# Patient Record
Sex: Female | Born: 1970 | Race: White | Hispanic: No | Marital: Married | Smoking: Never smoker
Health system: Southern US, Community
[De-identification: ages and names within clinical notes are randomized; demographics above are authoritative.]

## PROBLEM LIST (undated history)

## (undated) DIAGNOSIS — G8929 Other chronic pain: Secondary | ICD-10-CM

## (undated) DIAGNOSIS — N3010 Interstitial cystitis (chronic) without hematuria: Secondary | ICD-10-CM

## (undated) DIAGNOSIS — M5030 Other cervical disc degeneration, unspecified cervical region: Secondary | ICD-10-CM

## (undated) DIAGNOSIS — J302 Other seasonal allergic rhinitis: Secondary | ICD-10-CM

## (undated) DIAGNOSIS — M542 Cervicalgia: Secondary | ICD-10-CM

## (undated) DIAGNOSIS — R112 Nausea with vomiting, unspecified: Secondary | ICD-10-CM

## (undated) DIAGNOSIS — M359 Systemic involvement of connective tissue, unspecified: Secondary | ICD-10-CM

## (undated) DIAGNOSIS — Z87828 Personal history of other (healed) physical injury and trauma: Secondary | ICD-10-CM

## (undated) DIAGNOSIS — I10 Essential (primary) hypertension: Secondary | ICD-10-CM

## (undated) DIAGNOSIS — K219 Gastro-esophageal reflux disease without esophagitis: Secondary | ICD-10-CM

## (undated) DIAGNOSIS — F32A Depression, unspecified: Secondary | ICD-10-CM

## (undated) DIAGNOSIS — Z9889 Other specified postprocedural states: Secondary | ICD-10-CM

## (undated) DIAGNOSIS — R29898 Other symptoms and signs involving the musculoskeletal system: Secondary | ICD-10-CM

## (undated) DIAGNOSIS — F410 Panic disorder [episodic paroxysmal anxiety] without agoraphobia: Secondary | ICD-10-CM

## (undated) DIAGNOSIS — R350 Frequency of micturition: Secondary | ICD-10-CM

## (undated) DIAGNOSIS — F419 Anxiety disorder, unspecified: Secondary | ICD-10-CM

## (undated) DIAGNOSIS — R351 Nocturia: Secondary | ICD-10-CM

## (undated) DIAGNOSIS — K589 Irritable bowel syndrome without diarrhea: Secondary | ICD-10-CM

## (undated) DIAGNOSIS — E119 Type 2 diabetes mellitus without complications: Secondary | ICD-10-CM

## (undated) DIAGNOSIS — R3915 Urgency of urination: Secondary | ICD-10-CM

## (undated) DIAGNOSIS — K649 Unspecified hemorrhoids: Secondary | ICD-10-CM

## (undated) DIAGNOSIS — F329 Major depressive disorder, single episode, unspecified: Secondary | ICD-10-CM

## (undated) DIAGNOSIS — K7689 Other specified diseases of liver: Secondary | ICD-10-CM

## (undated) HISTORY — DX: Unspecified hemorrhoids: K64.9

## (undated) HISTORY — PX: KNEE ARTHROSCOPY: SUR90

## (undated) HISTORY — PX: OTHER SURGICAL HISTORY: SHX169

## (undated) HISTORY — PX: TONSILLECTOMY: SUR1361

## (undated) SURGERY — Surgical Case
Anesthesia: *Unknown

---

## 1991-04-12 HISTORY — PX: ECTOPIC PREGNANCY SURGERY: SHX613

## 1999-09-29 ENCOUNTER — Other Ambulatory Visit: Admission: RE | Admit: 1999-09-29 | Discharge: 1999-09-29 | Payer: Self-pay | Admitting: Obstetrics and Gynecology

## 2000-08-10 ENCOUNTER — Ambulatory Visit (HOSPITAL_COMMUNITY): Admission: RE | Admit: 2000-08-10 | Discharge: 2000-08-10 | Payer: Self-pay | Admitting: *Deleted

## 2000-08-10 ENCOUNTER — Encounter: Payer: Self-pay | Admitting: *Deleted

## 2000-10-05 ENCOUNTER — Other Ambulatory Visit: Admission: RE | Admit: 2000-10-05 | Discharge: 2000-10-05 | Payer: Self-pay | Admitting: Obstetrics and Gynecology

## 2000-10-25 ENCOUNTER — Ambulatory Visit (HOSPITAL_COMMUNITY): Admission: RE | Admit: 2000-10-25 | Discharge: 2000-10-25 | Payer: Self-pay | Admitting: *Deleted

## 2000-10-25 ENCOUNTER — Encounter: Payer: Self-pay | Admitting: *Deleted

## 2001-05-01 ENCOUNTER — Ambulatory Visit (HOSPITAL_COMMUNITY): Admission: RE | Admit: 2001-05-01 | Discharge: 2001-05-01 | Payer: Self-pay | Admitting: Internal Medicine

## 2001-07-05 ENCOUNTER — Inpatient Hospital Stay (HOSPITAL_COMMUNITY): Admission: RE | Admit: 2001-07-05 | Discharge: 2001-07-07 | Payer: Self-pay | Admitting: *Deleted

## 2001-07-05 HISTORY — PX: VAGINAL HYSTERECTOMY: SUR661

## 2001-09-03 ENCOUNTER — Ambulatory Visit (HOSPITAL_COMMUNITY): Admission: RE | Admit: 2001-09-03 | Discharge: 2001-09-03 | Payer: Self-pay | Admitting: Internal Medicine

## 2001-09-03 ENCOUNTER — Encounter: Payer: Self-pay | Admitting: Internal Medicine

## 2001-11-02 ENCOUNTER — Observation Stay (HOSPITAL_COMMUNITY): Admission: RE | Admit: 2001-11-02 | Discharge: 2001-11-03 | Payer: Self-pay | Admitting: General Surgery

## 2001-11-02 HISTORY — PX: LAPAROSCOPIC CHOLECYSTECTOMY: SUR755

## 2002-05-31 ENCOUNTER — Ambulatory Visit (HOSPITAL_COMMUNITY): Admission: RE | Admit: 2002-05-31 | Discharge: 2002-05-31 | Payer: Self-pay

## 2002-05-31 ENCOUNTER — Encounter: Payer: Self-pay | Admitting: Urology

## 2002-05-31 HISTORY — PX: OTHER SURGICAL HISTORY: SHX169

## 2002-09-04 ENCOUNTER — Other Ambulatory Visit: Admission: RE | Admit: 2002-09-04 | Discharge: 2002-09-04 | Payer: Self-pay | Admitting: *Deleted

## 2002-09-18 ENCOUNTER — Ambulatory Visit (HOSPITAL_COMMUNITY): Admission: RE | Admit: 2002-09-18 | Discharge: 2002-09-18 | Payer: Self-pay | Admitting: Family Medicine

## 2002-09-18 ENCOUNTER — Encounter: Payer: Self-pay | Admitting: Family Medicine

## 2002-12-19 ENCOUNTER — Encounter: Payer: Self-pay | Admitting: Internal Medicine

## 2002-12-19 ENCOUNTER — Ambulatory Visit (HOSPITAL_COMMUNITY): Admission: RE | Admit: 2002-12-19 | Discharge: 2002-12-19 | Payer: Self-pay | Admitting: Internal Medicine

## 2003-01-20 ENCOUNTER — Ambulatory Visit (HOSPITAL_COMMUNITY): Admission: RE | Admit: 2003-01-20 | Discharge: 2003-01-20 | Payer: Self-pay | Admitting: Internal Medicine

## 2003-01-30 ENCOUNTER — Ambulatory Visit (HOSPITAL_COMMUNITY): Admission: RE | Admit: 2003-01-30 | Discharge: 2003-01-30 | Payer: Self-pay | Admitting: Family Medicine

## 2003-01-30 ENCOUNTER — Encounter: Payer: Self-pay | Admitting: Family Medicine

## 2003-02-11 ENCOUNTER — Encounter (HOSPITAL_COMMUNITY): Admission: RE | Admit: 2003-02-11 | Discharge: 2003-03-13 | Payer: Self-pay | Admitting: Family Medicine

## 2003-03-30 ENCOUNTER — Emergency Department (HOSPITAL_COMMUNITY): Admission: AC | Admit: 2003-03-30 | Discharge: 2003-03-30 | Payer: Self-pay

## 2003-04-07 ENCOUNTER — Emergency Department (HOSPITAL_COMMUNITY): Admission: AD | Admit: 2003-04-07 | Discharge: 2003-04-07 | Payer: Self-pay | Admitting: Family Medicine

## 2003-05-22 ENCOUNTER — Ambulatory Visit (HOSPITAL_COMMUNITY): Admission: RE | Admit: 2003-05-22 | Discharge: 2003-05-22 | Payer: Self-pay | Admitting: Family Medicine

## 2003-07-19 ENCOUNTER — Emergency Department (HOSPITAL_COMMUNITY): Admission: AD | Admit: 2003-07-19 | Discharge: 2003-07-19 | Payer: Self-pay | Admitting: Family Medicine

## 2003-08-04 ENCOUNTER — Ambulatory Visit (HOSPITAL_COMMUNITY): Admission: RE | Admit: 2003-08-04 | Discharge: 2003-08-04 | Payer: Self-pay | Admitting: Internal Medicine

## 2003-09-01 ENCOUNTER — Ambulatory Visit (HOSPITAL_BASED_OUTPATIENT_CLINIC_OR_DEPARTMENT_OTHER): Admission: RE | Admit: 2003-09-01 | Discharge: 2003-09-01 | Payer: Self-pay | Admitting: Urology

## 2003-09-01 ENCOUNTER — Ambulatory Visit (HOSPITAL_COMMUNITY): Admission: RE | Admit: 2003-09-01 | Discharge: 2003-09-01 | Payer: Self-pay | Admitting: Urology

## 2003-09-15 ENCOUNTER — Other Ambulatory Visit: Admission: RE | Admit: 2003-09-15 | Discharge: 2003-09-15 | Payer: Self-pay | Admitting: Obstetrics and Gynecology

## 2003-10-01 ENCOUNTER — Ambulatory Visit (HOSPITAL_COMMUNITY): Admission: RE | Admit: 2003-10-01 | Discharge: 2003-10-01 | Payer: Self-pay | Admitting: Internal Medicine

## 2004-03-15 ENCOUNTER — Other Ambulatory Visit: Admission: RE | Admit: 2004-03-15 | Discharge: 2004-03-15 | Payer: Self-pay | Admitting: Obstetrics and Gynecology

## 2004-07-15 ENCOUNTER — Other Ambulatory Visit: Admission: RE | Admit: 2004-07-15 | Discharge: 2004-07-15 | Payer: Self-pay | Admitting: Obstetrics and Gynecology

## 2004-09-11 IMAGING — CT CT PELVIS W/O CM
1 of 2 series · 15 of 32 positions shown, 19 images · non-contrast
Comparison: none

CLINICAL DATA: Rt flank pain; hematuria; hx of kidney stones
 CT ABDOMEN WITHOUT CONTRAST
 Multidetector helical CT imaging abdomen and pelvis performed using kidney stone protocol.  Neither oral nor intravenous contrast utilized for this indication.  Comparison 12/19/02.  
 ABDOMEN
 Lung bases clear.  Status post cholecystectomy.  Tiny bilateral renal calculi.  No hydronephrosis or ureteral calcification seen.  Tiny hepatic cyst stable, 7 mm diameter (image 14).  Remaining solid organs and bowel loops unremarkable for exam lacking IV and oral contrast.  
 IMPRESSION
 Tiny bilateral renal calculi without hydronephrosis or urinary tract obstruction.  Tiny hepatic cysts stable.
 PELVIS
 Very small lipoma right iliopsoas, 2.0 x 1.0 cm in size (image 39).  No distal ureteral calcification or dilatation.  No pelvic mass, adenopathy, or free fluid.  Tiny left pelvic phlebolith.  Bladder decompressed.  Stool throughout colon.  Status post hysterectomy.  No definite mass identified within limitations of an exam lacking oral and IV contrast.  A short segment of the appendix is visualized and is normal. 
 Slight constipation.  Otherwise negative exam.

[Series 5091: — · axial · 0.56mm/px · z∈[+1346,+1676]mm · 15 of 74 slices shown, 19 images]
[im 4/74  soft-tissue]
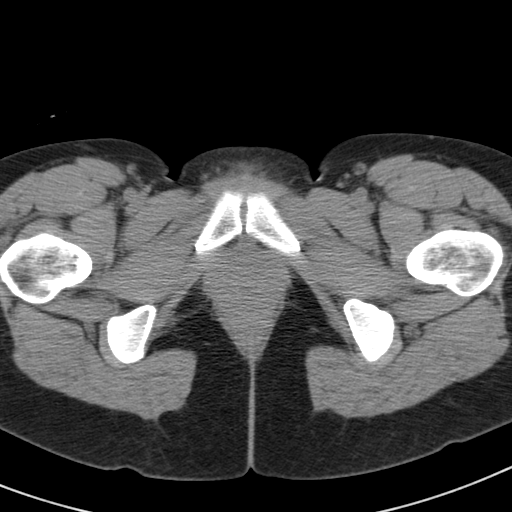
[im 4/74  bone]
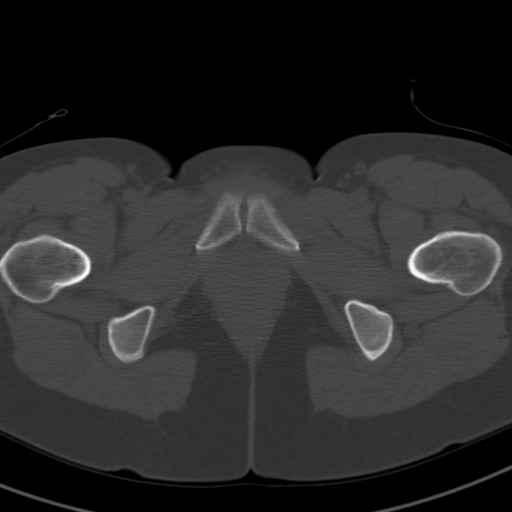
[im 10/74  soft-tissue]
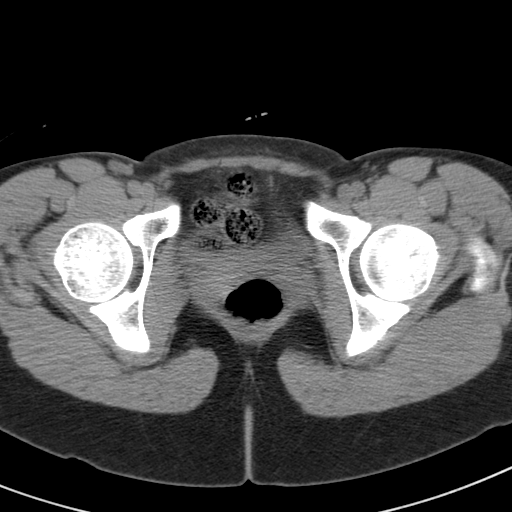
[im 16/74  soft-tissue]
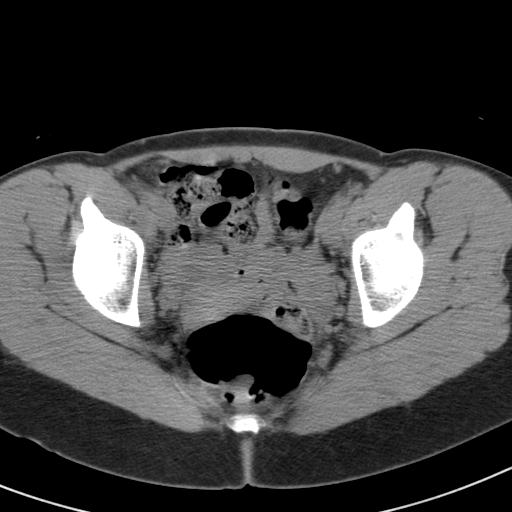
[im 20/74  soft-tissue]
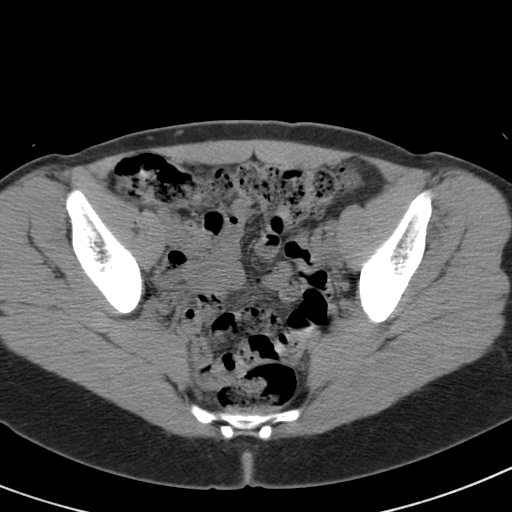
[im 26/74  soft-tissue]
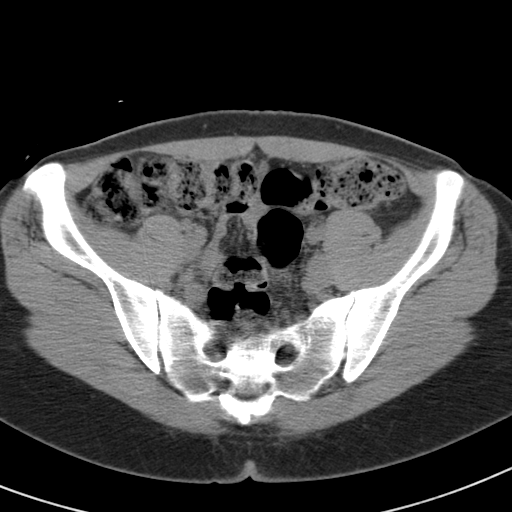
[im 32/74  soft-tissue]
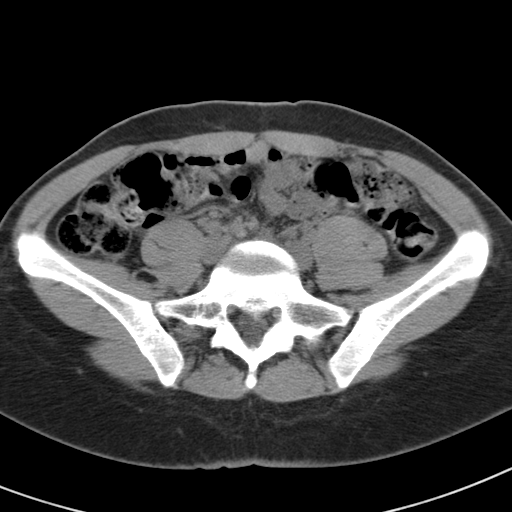
[im 39/74  soft-tissue]
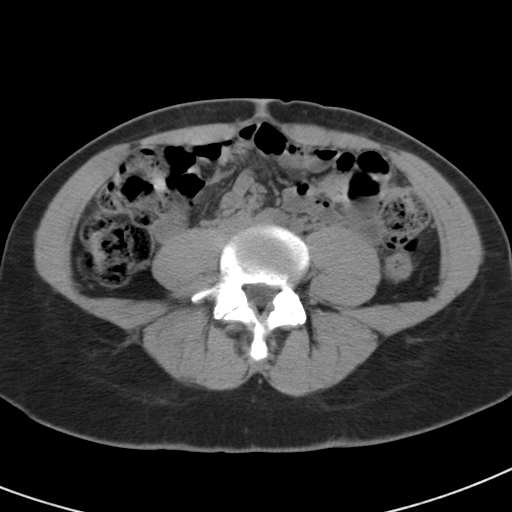
[im 42/74  soft-tissue]
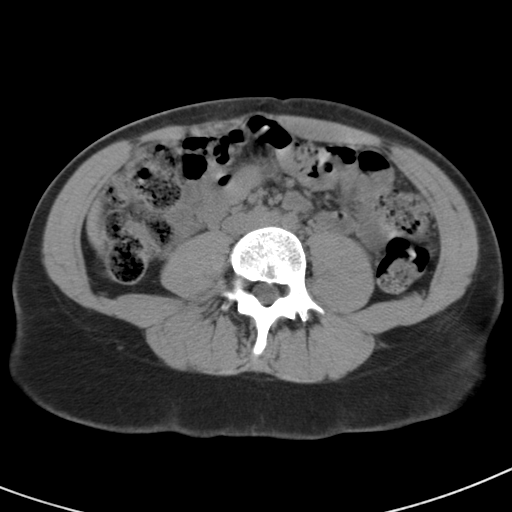
[im 48/74  soft-tissue]
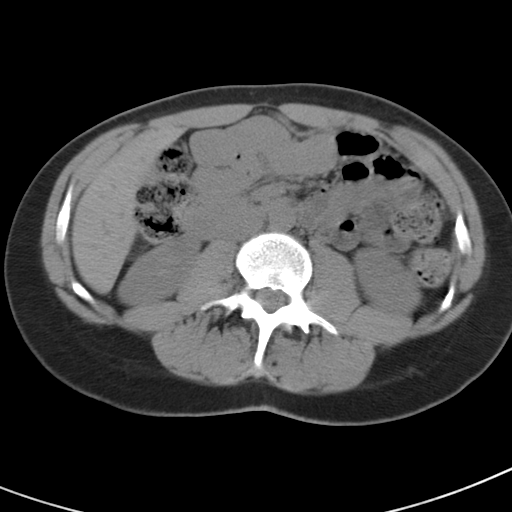
[im 48/74  bone]
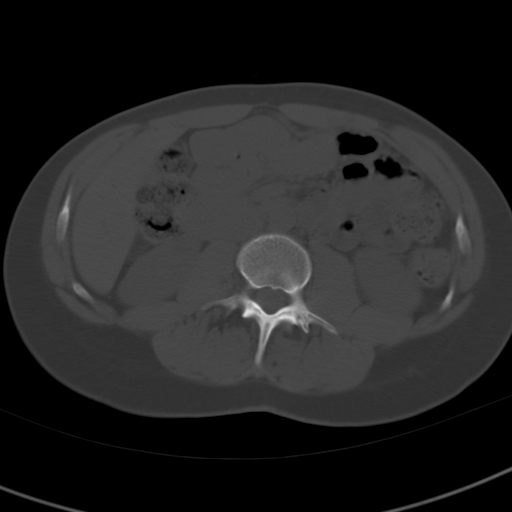
[im 54/74  soft-tissue]
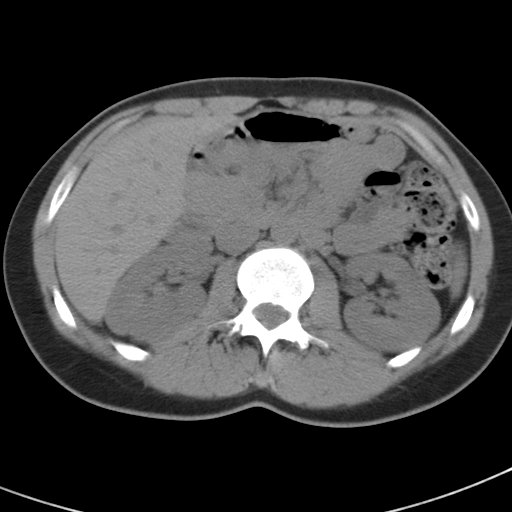
[im 58/74  soft-tissue]
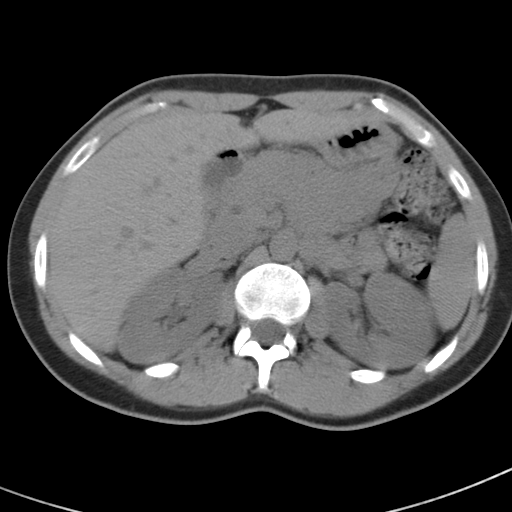
[im 61/74  lung]
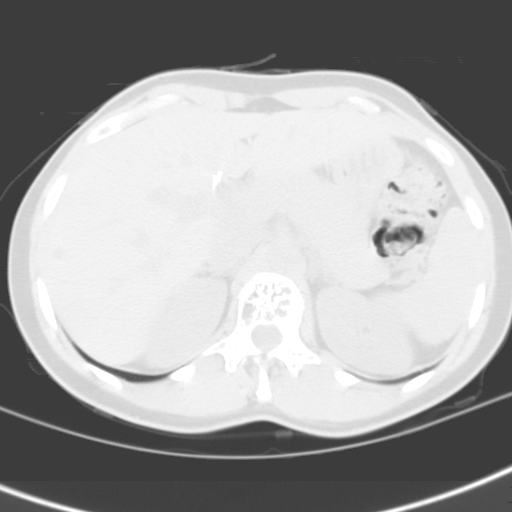
[im 64/74  soft-tissue]
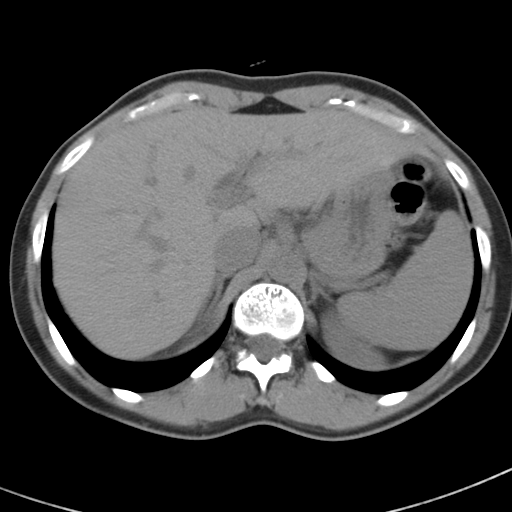
[im 64/74  lung]
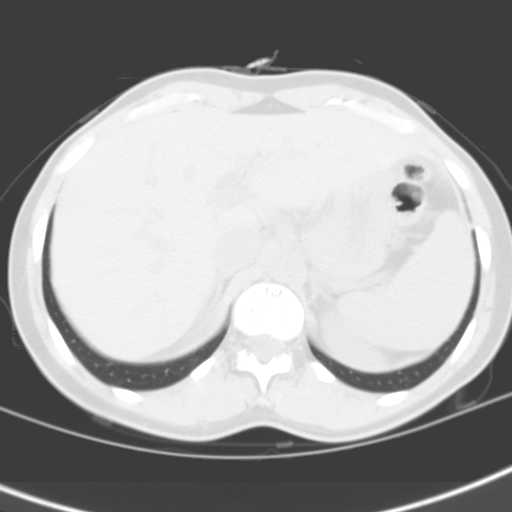
[im 67/74  lung]
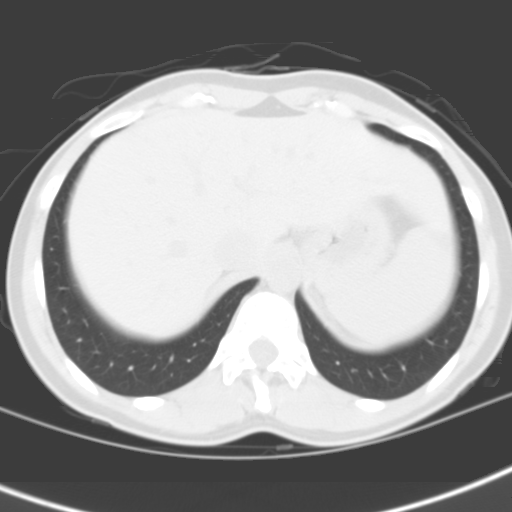
[im 70/74  soft-tissue]
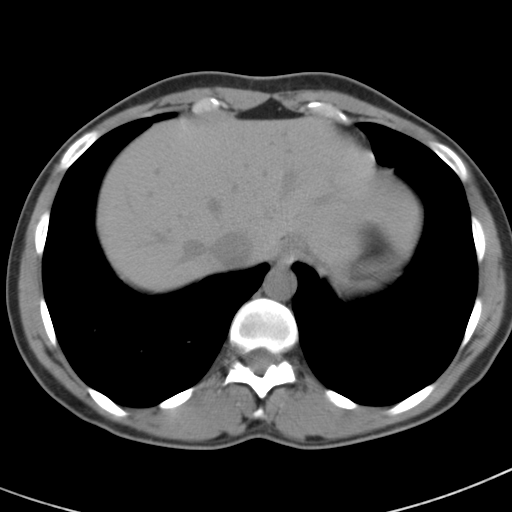
[im 70/74  lung]
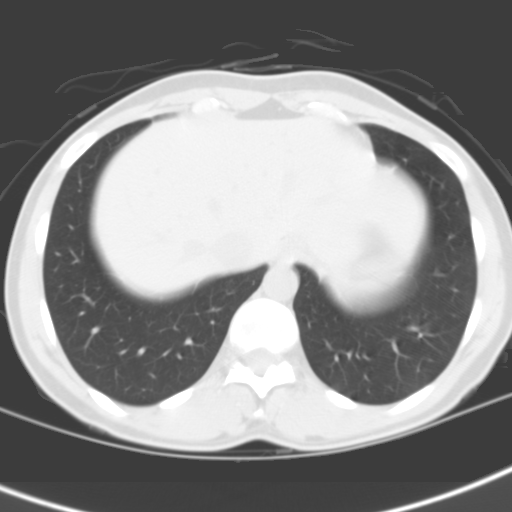

[15 of 32 positions shown; findings below may reference images not displayed]

## 2004-09-20 ENCOUNTER — Emergency Department (HOSPITAL_COMMUNITY): Admission: EM | Admit: 2004-09-20 | Discharge: 2004-09-20 | Payer: Self-pay | Admitting: Emergency Medicine

## 2005-01-24 ENCOUNTER — Other Ambulatory Visit: Admission: RE | Admit: 2005-01-24 | Discharge: 2005-01-24 | Payer: Self-pay | Admitting: Obstetrics and Gynecology

## 2005-02-21 ENCOUNTER — Ambulatory Visit (HOSPITAL_COMMUNITY): Admission: RE | Admit: 2005-02-21 | Discharge: 2005-02-21 | Payer: Self-pay | Admitting: Family Medicine

## 2005-02-24 ENCOUNTER — Ambulatory Visit (HOSPITAL_COMMUNITY): Admission: RE | Admit: 2005-02-24 | Discharge: 2005-02-24 | Payer: Self-pay | Admitting: Family Medicine

## 2005-04-25 ENCOUNTER — Ambulatory Visit (HOSPITAL_COMMUNITY): Admission: RE | Admit: 2005-04-25 | Discharge: 2005-04-26 | Payer: Self-pay | Admitting: Neurosurgery

## 2005-04-25 HISTORY — PX: ANTERIOR CERVICAL DECOMP/DISCECTOMY FUSION: SHX1161

## 2006-04-03 ENCOUNTER — Encounter: Admission: RE | Admit: 2006-04-03 | Discharge: 2006-04-03 | Payer: Self-pay | Admitting: Neurosurgery

## 2006-05-22 ENCOUNTER — Ambulatory Visit (HOSPITAL_COMMUNITY): Admission: RE | Admit: 2006-05-22 | Discharge: 2006-05-22 | Payer: Self-pay | Admitting: Neurosurgery

## 2006-06-06 ENCOUNTER — Emergency Department (HOSPITAL_COMMUNITY): Admission: EM | Admit: 2006-06-06 | Discharge: 2006-06-06 | Payer: Self-pay | Admitting: Family Medicine

## 2006-06-07 ENCOUNTER — Emergency Department (HOSPITAL_COMMUNITY): Admission: EM | Admit: 2006-06-07 | Discharge: 2006-06-07 | Payer: Self-pay | Admitting: Family Medicine

## 2006-06-07 ENCOUNTER — Emergency Department (HOSPITAL_COMMUNITY): Admission: EM | Admit: 2006-06-07 | Discharge: 2006-06-07 | Payer: Self-pay | Admitting: Emergency Medicine

## 2006-09-26 ENCOUNTER — Ambulatory Visit (HOSPITAL_BASED_OUTPATIENT_CLINIC_OR_DEPARTMENT_OTHER): Admission: RE | Admit: 2006-09-26 | Discharge: 2006-09-26 | Payer: Self-pay | Admitting: Orthopaedic Surgery

## 2007-01-24 ENCOUNTER — Ambulatory Visit: Payer: Self-pay | Admitting: Internal Medicine

## 2007-02-15 ENCOUNTER — Ambulatory Visit: Payer: Self-pay | Admitting: Internal Medicine

## 2007-02-15 ENCOUNTER — Ambulatory Visit (HOSPITAL_COMMUNITY): Admission: RE | Admit: 2007-02-15 | Discharge: 2007-02-15 | Payer: Self-pay | Admitting: Internal Medicine

## 2007-02-15 HISTORY — PX: ESOPHAGOGASTRODUODENOSCOPY: SHX1529

## 2007-05-29 ENCOUNTER — Ambulatory Visit: Payer: Self-pay | Admitting: Internal Medicine

## 2007-06-13 ENCOUNTER — Ambulatory Visit: Payer: Self-pay | Admitting: Internal Medicine

## 2007-06-18 ENCOUNTER — Ambulatory Visit: Payer: Self-pay | Admitting: Internal Medicine

## 2007-06-18 ENCOUNTER — Ambulatory Visit (HOSPITAL_COMMUNITY): Admission: RE | Admit: 2007-06-18 | Discharge: 2007-06-18 | Payer: Self-pay | Admitting: Internal Medicine

## 2007-06-18 HISTORY — PX: ESOPHAGOGASTRODUODENOSCOPY: SHX1529

## 2007-06-18 HISTORY — PX: BRAVO PH STUDY: SHX5421

## 2007-07-18 ENCOUNTER — Ambulatory Visit: Payer: Self-pay | Admitting: Gastroenterology

## 2007-10-25 ENCOUNTER — Ambulatory Visit: Payer: Self-pay | Admitting: Internal Medicine

## 2007-11-13 ENCOUNTER — Ambulatory Visit (HOSPITAL_COMMUNITY): Admission: RE | Admit: 2007-11-13 | Discharge: 2007-11-13 | Payer: Self-pay | Admitting: Anesthesiology

## 2007-12-20 DIAGNOSIS — K649 Unspecified hemorrhoids: Secondary | ICD-10-CM | POA: Insufficient documentation

## 2007-12-20 DIAGNOSIS — K219 Gastro-esophageal reflux disease without esophagitis: Secondary | ICD-10-CM

## 2007-12-20 DIAGNOSIS — Z8659 Personal history of other mental and behavioral disorders: Secondary | ICD-10-CM | POA: Insufficient documentation

## 2007-12-20 DIAGNOSIS — K589 Irritable bowel syndrome without diarrhea: Secondary | ICD-10-CM | POA: Insufficient documentation

## 2007-12-20 DIAGNOSIS — I10 Essential (primary) hypertension: Secondary | ICD-10-CM | POA: Insufficient documentation

## 2007-12-20 DIAGNOSIS — N200 Calculus of kidney: Secondary | ICD-10-CM | POA: Insufficient documentation

## 2007-12-20 DIAGNOSIS — R12 Heartburn: Secondary | ICD-10-CM

## 2007-12-20 DIAGNOSIS — R112 Nausea with vomiting, unspecified: Secondary | ICD-10-CM | POA: Insufficient documentation

## 2008-01-01 ENCOUNTER — Ambulatory Visit: Payer: Self-pay | Admitting: Internal Medicine

## 2008-12-05 ENCOUNTER — Ambulatory Visit: Payer: Self-pay | Admitting: Internal Medicine

## 2008-12-31 ENCOUNTER — Encounter: Payer: Self-pay | Admitting: Urgent Care

## 2009-02-02 ENCOUNTER — Encounter: Payer: Self-pay | Admitting: Urgent Care

## 2009-02-17 ENCOUNTER — Encounter: Payer: Self-pay | Admitting: Gastroenterology

## 2009-03-05 IMAGING — CR DG SACRUM/COCCYX 2+V
3 series · 3 of 3 positions shown · non-contrast
Comparison: None

CLINICAL DATA: Injury 1 month ago.  Pain.

SACRUM AND COCCYX - 2+ VIEW

[t sacrum a.p.]
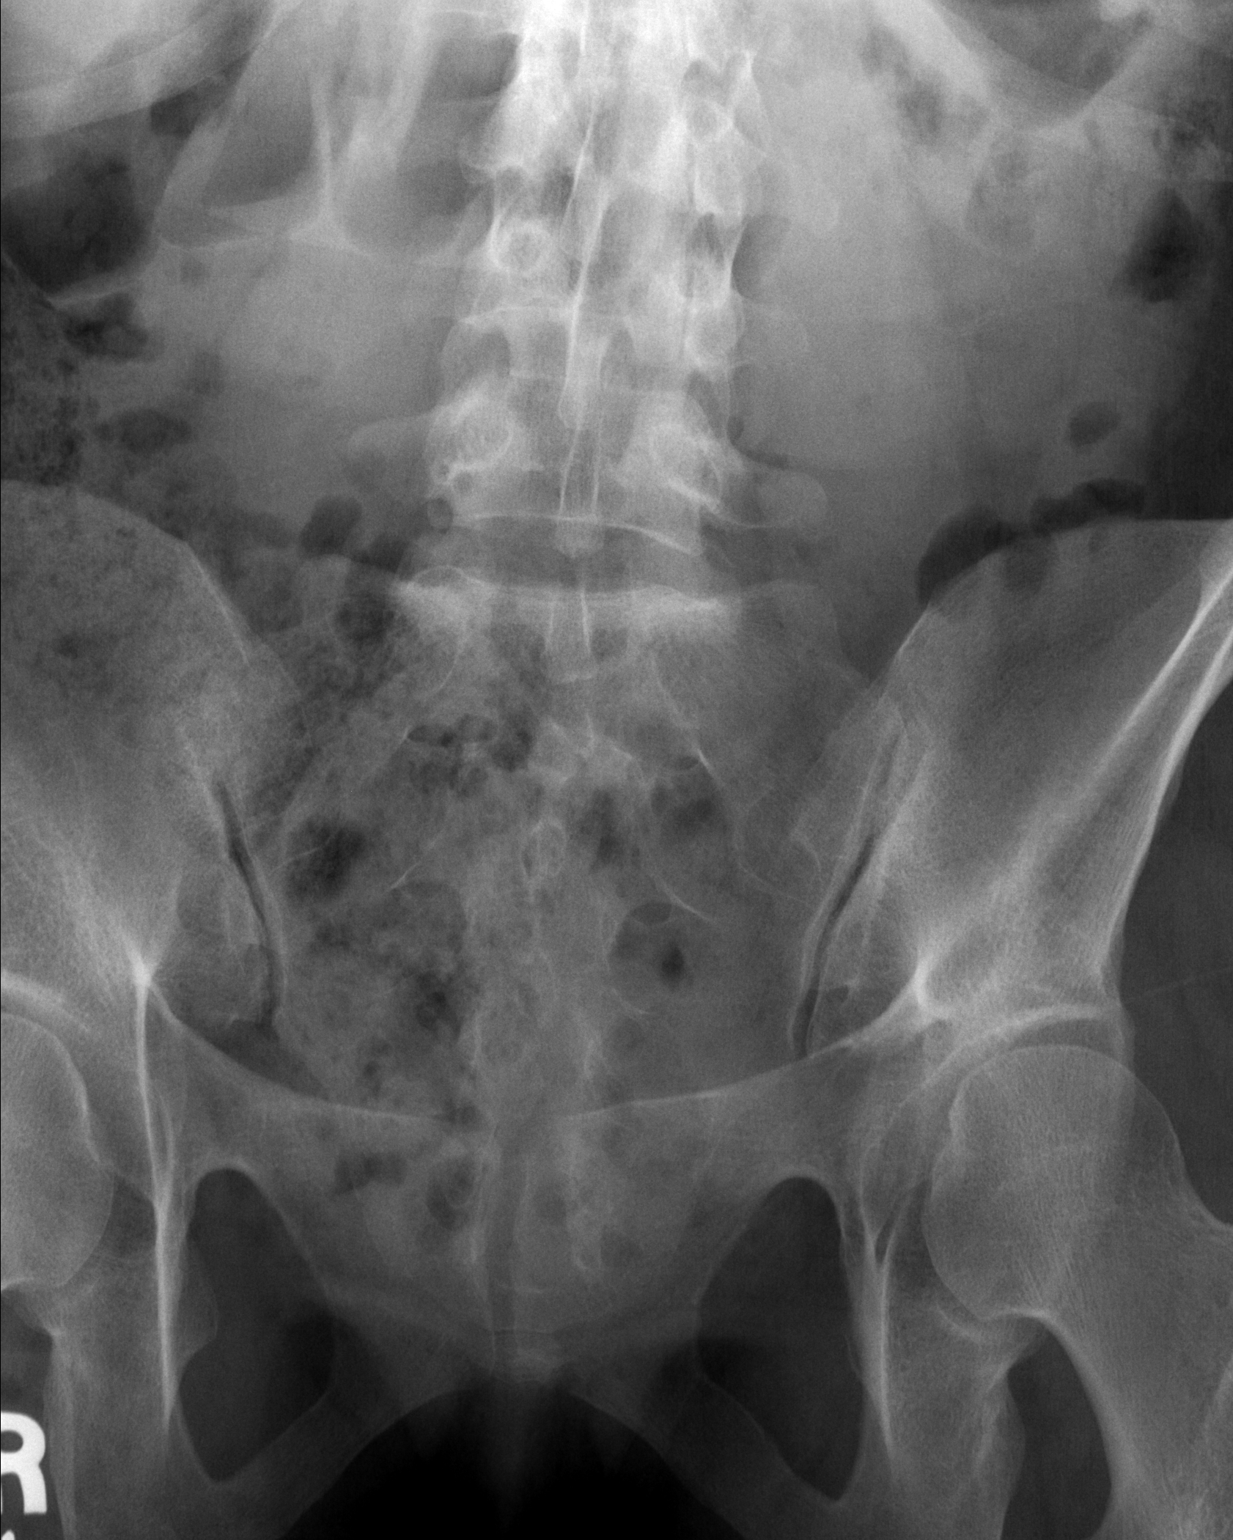

[t coccyx a.p.]
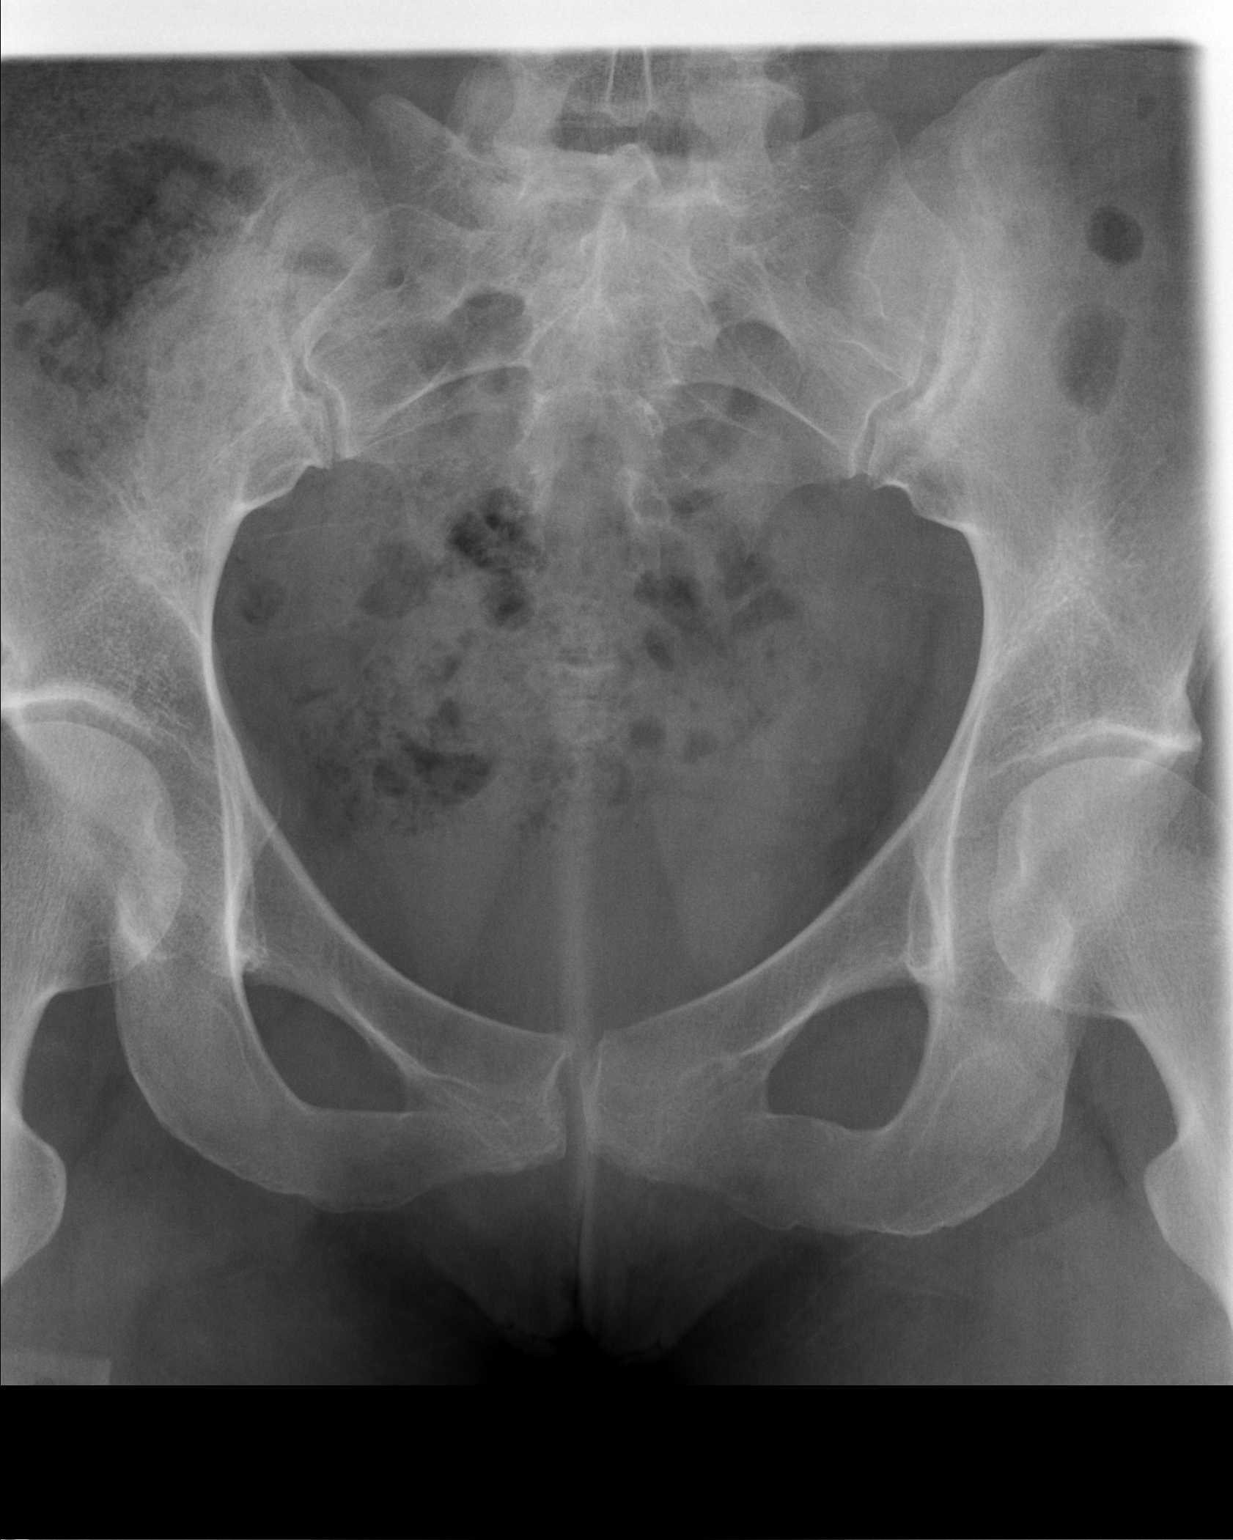

[t coccyx lat]
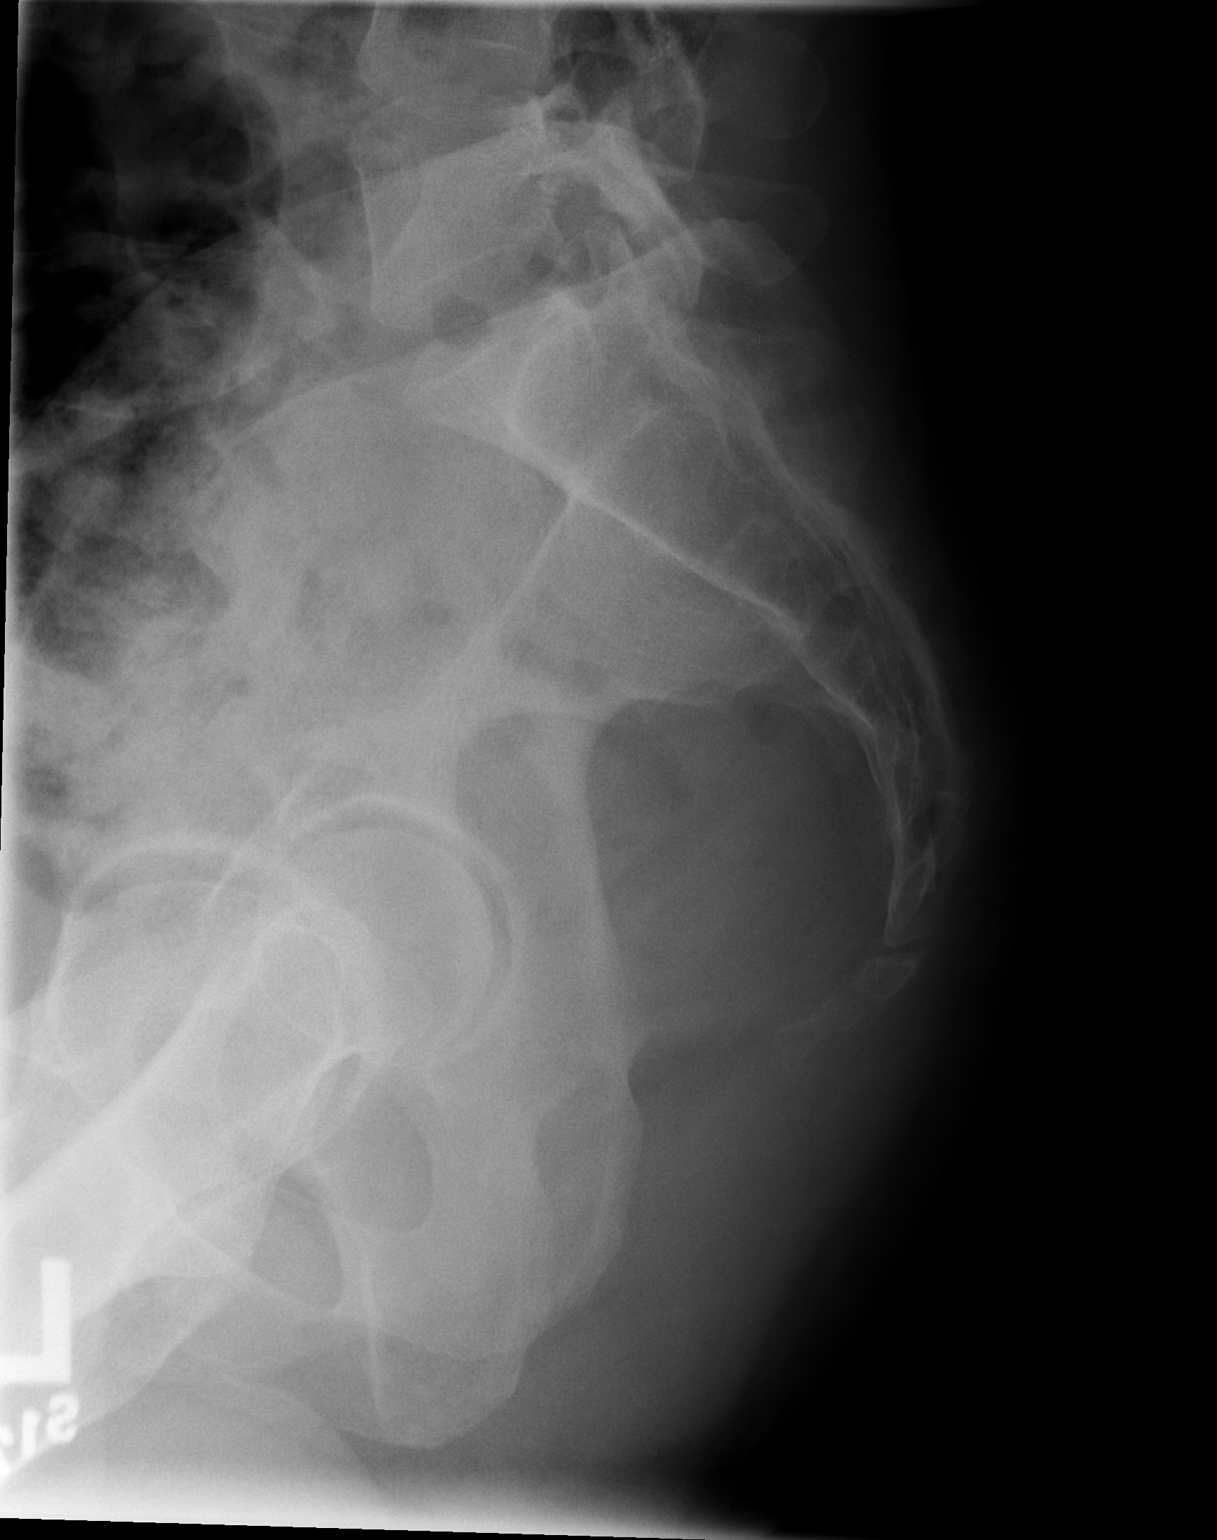

[3 of 3 positions shown; findings below may reference images not displayed]

FINDINGS: There is no evidence of sacral or coccygeal fracture.  SI
joints appear normal.
IMPRESSION: Negative

## 2009-04-08 ENCOUNTER — Encounter: Payer: Self-pay | Admitting: Gastroenterology

## 2009-04-13 ENCOUNTER — Encounter: Payer: Self-pay | Admitting: Gastroenterology

## 2009-04-14 ENCOUNTER — Encounter: Payer: Self-pay | Admitting: Gastroenterology

## 2009-11-10 ENCOUNTER — Ambulatory Visit: Payer: Self-pay | Admitting: Internal Medicine

## 2010-01-13 ENCOUNTER — Encounter: Payer: Self-pay | Admitting: Urgent Care

## 2010-01-26 ENCOUNTER — Ambulatory Visit (HOSPITAL_BASED_OUTPATIENT_CLINIC_OR_DEPARTMENT_OTHER): Admission: RE | Admit: 2010-01-26 | Discharge: 2010-01-26 | Payer: Self-pay | Admitting: Urology

## 2010-03-11 ENCOUNTER — Encounter (INDEPENDENT_AMBULATORY_CARE_PROVIDER_SITE_OTHER): Payer: Self-pay | Admitting: *Deleted

## 2010-05-13 NOTE — Assessment & Plan Note (Signed)
Summary: f/u gerd/jbb   Visit Type:  Follow-up Visit [facility]:  Dr Sherwood Gambler  Chief Complaint:  follow up- doing ok most of the time.  History of Present Illness: 40 y/o caucasian female here FU GERD.  Denies heartburn & indigestion, unless she "stresses".  Stays away from spicy foods.  Wants anti-spasmotics, diarrhea w/ IBS.  Married October 2010.  c/o hemorrhoid c/o proctalgia, no bleeding.  Taking Nexium 40mg  two times a day, is willing to try & decrease to once daily dosage.  Current Problems (verified): 1)  Family History of Pancreatic Cancer  () 2)  Family History of Thyroid Disease  () 3)  Amily History of Diabetes  () 4)  Hemorrhoids  (ICD-455.6) 5)  Depression  () 6)  Irritable Bowel Syndrome  (ICD-564.1) 7)  Anxiety Disorder, Hx of  (ICD-V11.2) 8)  Nephrolithiasis  (ICD-592.0) 9)  Arthritis  () 10)  Hypertension  (ICD-401.9) 11)  Nausea With Vomiting  (ICD-787.01) 12)  Heartburn  (ICD-787.1) 13)  Gerd  (ICD-530.81)  Current Medications (verified): 1)  Propranolol Hcl 20 Mg Tabs (Propranolol Hcl) .... Daily 2)  Nexium 40 Mg Cpdr (Esomeprazole Magnesium) .... One By Mouth Two Times A Day For Acid Reflux 3)  Tylox .... One Daily 4)  Lactaid .... Two Times A Day As Needed 5)  Alprazolam 0.5mg  .... As Needed 6)  Miralax  Powd (Polyethylene Glycol 3350) .... Once Daily 7)  Amitriptyline Hcl 25 Mg Tabs (Amitriptyline Hcl) .... As Needed 8)  Hyomax-Sl 0.125 Mg Subl (Hyoscyamine Sulfate) .Marland Kitchen.. 1 By Mouth Ac/hs (Up To Qid) For Ibs 9)  Nexium 40 Mg Cpdr (Esomeprazole Magnesium) .Marland Kitchen.. 1 By Mouth Bid For Acid Reflux 10)  Anusol-Hc 25 Mg Supp (Hydrocortisone Acetate) .Marland Kitchen.. 1 Pr Two Times A Day  Allergies (verified): 1)  ! Morphine  Past History:  Past Medical History: HEMORRHOIDS (ICD-455.6) colonoscopy 2003 DEPRESSION IRRITABLE BOWEL SYNDROME (ICD-564.1) ANXIETY DISORDER, HX OF (ICD-V11.2) NEPHROLITHIASIS (ICD-592.0) ARTHRITIS HYPERTENSION GERD (ICD-530.81) EGD  normal 06/2007, Bravo pH->good control on two times a day PPI  Past Surgical History: H-pylori serology EGD Bilateral knee replacement Bilateral tubal ligation  partial hysterectomy cholecystectomy cervical disk surgeryy  Social History: Marital Status:yes Children: 2 Occupation: [place] [employer] Patient has never smoked.  Alcohol Use - no  Review of Systems      See HPI  Vital Signs:  Patient profile:   40 year old female Height:      65 inches Weight:      139 pounds BMI:     23.21 Temp:     98.0 degrees F oral Pulse rate:   68 / minute BP sitting:   124 / 80  (left arm) Cuff size:   regular  Vitals Entered By: Hendricks Limes LPN (November 10, 2009 12:13 PM)  Physical Exam  General:  alert conversant no acute distress Eyes:  no scleral icterus conjunctivae pink Mouth:  No deformity or lesions, dentition normal. Lungs:  Clear throughout to auscultation. Heart:  Regular rate and rhythm; no murmurs, rubs,  or bruits. Abdomen:  Soft, nontender and nondistended. No masses, hepatosplenomegaly or hernias noted. Normal bowel sounds. Msk:  Symmetrical with no gross deformities. Normal posture. Pulses:  Normal pulses noted. Neurologic:  Alert and  oriented x4;  grossly normal neurologically. Skin:  Intact without significant lesions or rashes. Cervical Nodes:  No significant cervical adenopathy. Psych:  Alert and cooperative. Normal mood and affect.  Impression & Recommendations:  Problem # 1:  GERD (ICD-530.81) well  controlled on two times a day protonix.  Trial once daily dosing.  Discussed potential increased risk osteoporosis. Orders: Est. Patient Level III (78295)  Problem # 2:  IRRITABLE BOWEL SYNDROME (ICD-564.1) At baseline, doing well  Problem # 3:  HEMORRHOIDS (ICD-455.6) flare  Patient Instructions: 1)  Start calcium 1200mg  daily 2)  Follow up w/ PCP about bone density screening 3)  Trial nexium once daily, if unable, let us know 4)  Please schedule a  follow-up appointment as needed or 2 yrs Dr Jena Gauss 5)  The medication list was reviewed and reconciled.  All changed / newly prescribed medications were explained.  A complete medication list was provided to the patient / caregiver. Prescriptions: ANUSOL-HC 25 MG SUPP (HYDROCORTISONE ACETATE) 1 pr two times a day  #20 x 0   Entered and Authorized by:   Joselyn Arrow FNP-BC   Signed by:   Joselyn Arrow FNP-BC on 11/10/2009   Method used:   Electronically to        [facility] (retail)       [address]       [place], [place]  [address]       Ph: [phone]       Fax: [phone]   RxID:   [id number] NEXIUM 40 MG CPDR (ESOMEPRAZOLE MAGNESIUM) 1 by mouth bid for acid reflux  #60 x 5   Entered and Authorized by:   Joselyn Arrow FNP-BC   Signed by:   Joselyn Arrow FNP-BC on 11/10/2009   Method used:   Electronically to        [facility] (retail)       [address]       [place], [place]  [address]       Ph: [phone]       Fax: [phone]   RxID:   [id number] HYOMAX-SL 0.125 MG SUBL (HYOSCYAMINE SULFATE) 1 by mouth ac/hs (up to QID) for IBS  #60 x 5   Entered and Authorized by:   Joselyn Arrow FNP-BC   Signed by:   Joselyn Arrow FNP-BC on 11/10/2009   Method used:   Electronically to        [facility] (retail)       [address]       [place], [place]  [address]       Ph: [phone]       Fax: [phone]   RxID:   [id number]   Appended Document: f/u gerd/jbb 1 YEAR F/U APPT W/DR Jena Gauss IS IN THE COMPUTER PER KANDICE JONES/LAW  Appended Document: Orders Update    Clinical Lists Changes  Orders: Added new Service order of Est. Patient Level III (03500) - Signed

## 2010-05-13 NOTE — Medication Information (Signed)
Summary: PA for Nexium  PA for Nexium   Imported By: Hendricks Limes LPN 09/81/1914 78:29:56  _____________________________________________________________________  External Attachment:    Type:   Image     Comment:   External Document

## 2010-05-13 NOTE — Medication Information (Signed)
Summary: Tax adviser   Imported By: Diana Eves 04/14/2009 08:53:47  _____________________________________________________________________  External Attachment:    Type:   Image     Comment:   External Document  Appended Document: RX Folder addressed

## 2010-05-13 NOTE — Medication Information (Signed)
Summary: Tax adviser   Imported By: Ricard Dillon 04/13/2009 16:48:57  _____________________________________________________________________  External Attachment:    Type:   Image     Comment:   External Document  Appended Document: RX Folder duplicate

## 2010-05-13 NOTE — Medication Information (Signed)
Summary: NEXIUM 40MG   NEXIUM 40MG    Imported By: Rexene Alberts 03/11/2010 09:10:44  _____________________________________________________________________  External Attachment:    Type:   Image     Comment:   External Document  Appended Document: NEXIUM 40MG  needs prior auth.  Appended Document: NEXIUM 40MG  paperwork started

## 2010-06-23 LAB — POCT I-STAT 4, (NA,K, GLUC, HGB,HCT)
Glucose, Bld: 97 mg/dL (ref 70–99)
HCT: 33 % — ABNORMAL LOW (ref 36.0–46.0)
Hemoglobin: 11.2 g/dL — ABNORMAL LOW (ref 12.0–15.0)
Potassium: 4.5 mEq/L (ref 3.5–5.1)
Sodium: 141 mEq/L (ref 135–145)

## 2010-06-28 ENCOUNTER — Encounter: Payer: Self-pay | Admitting: Gastroenterology

## 2010-07-08 NOTE — Medication Information (Signed)
Summary: PA for nexium  PA for nexium   Imported By: Hendricks Limes LPN 21/30/8657 84:69:62  _____________________________________________________________________  External Attachment:    Type:   Image     Comment:   External Document

## 2010-07-21 ENCOUNTER — Other Ambulatory Visit: Payer: Self-pay | Admitting: Anesthesiology

## 2010-07-21 DIAGNOSIS — R52 Pain, unspecified: Secondary | ICD-10-CM

## 2010-07-29 ENCOUNTER — Ambulatory Visit (HOSPITAL_COMMUNITY)
Admission: RE | Admit: 2010-07-29 | Discharge: 2010-07-29 | Disposition: A | Discharge status: Home / Self Care | Payer: 59 | Source: Ambulatory Visit | Attending: Anesthesiology | Admitting: Anesthesiology

## 2010-07-29 DIAGNOSIS — Z981 Arthrodesis status: Secondary | ICD-10-CM | POA: Insufficient documentation

## 2010-07-29 DIAGNOSIS — M25519 Pain in unspecified shoulder: Secondary | ICD-10-CM | POA: Insufficient documentation

## 2010-07-29 DIAGNOSIS — M542 Cervicalgia: Secondary | ICD-10-CM | POA: Insufficient documentation

## 2010-07-29 DIAGNOSIS — M5020 Other cervical disc displacement, unspecified cervical region: Secondary | ICD-10-CM | POA: Insufficient documentation

## 2010-07-29 DIAGNOSIS — R52 Pain, unspecified: Secondary | ICD-10-CM

## 2010-07-30 ENCOUNTER — Other Ambulatory Visit: Payer: Self-pay | Admitting: Anesthesiology

## 2010-07-30 DIAGNOSIS — M542 Cervicalgia: Secondary | ICD-10-CM

## 2010-07-30 DIAGNOSIS — M25519 Pain in unspecified shoulder: Secondary | ICD-10-CM

## 2010-08-24 NOTE — Assessment & Plan Note (Signed)
NAMEMarland Kitchen  Colleen Duncan, Colleen Duncan                   CHART#:  [id number]   DATE:  01/01/2008                       DOB:  03-09-1971   CHIEF COMPLAINT:  Yellow mucus and blood in the stool, diarrhea,  abdominal pain, nausea, and vomiting.   PROBLEM LIST:  1. Gastroesophageal reflux disease.  2. IBS.  3. Bravo ambulatory esophageal pH study on June 18, 2007, done on      Protonix 40 mg b.i.d., revealed good control of gastroesophageal      reflux on acid suppression.  4. EGD on 06/18/2007 revealed normal esophagus and stomach, D1 and D2.  5. Anxiety.  6. Nephrolithiasis.  7. Colonoscopy 2003 revealed internal hemorrhoids.  8. Cholecystectomy in July 2003.   SUBJECTIVE:  The patient is here for further evaluation of above stated  symptoms.  She was last seen on 10/25/2007, was doing well at that time.  She states that she had been doing reasonably well up until 2 weeks ago.  One evening, she had a hamburger from a local restaurant, 2 hours later  she started having abdominal cramping followed by multiple bowel  movements, which were initially solid and proceeded to loose watery  stools.  She also had nausea and vomiting with it.  Over the last 2  weeks, she has had intermittent symptoms predominantly of multiple  stools and passage of the yellow and bloody mucus.  She tried 1 teaspoon  of Metamucil daily.  She did not have a lot of gas.  She continues to  have 3-4 stools a day.  Prior to this she had been taking MiraLax about  every other day or tendency towards constipation.  She notices if she  eats anything greasy, spicy, or red meat, or dairy products she tends to  have more problems with her IBS.  She is on Lactaid b.i.d. with little  improvement in her bloating and gastric discomfort.  She states her  refluxes are okay as long as she is on her Nexium b.i.d., but she does  note that her coverage will end as of January 10, 2008, and we will have  to fill out another prior authorization.  She has  already had well  documented failure on Prilosec, Protonix, Prevacid, Zegerid, and  Aciphex.   CURRENT MEDICATIONS:  See updated list.   ALLERGIES:  Morphine.   PHYSICAL EXAMINATION:  VITAL SIGNS:  Weight 138, temp 97.8, blood  pressure 104/72, and pulse 80.  GENERAL:  A pleasant, well-nourished, well-developed Caucasian female in  no acute distress.  SKIN:  Warm and dry and no jaundice.  HEENT:  Sclerae nonicteric.  Oropharyngeal mucosa moist and pink.  CHEST:  Lungs are clear to auscultation.  CARDIOVASCULAR:  Regular rate and rhythm.  No murmurs, rubs, or gallops.  ABDOMEN:  Positive bowel sounds.  Abdomen is soft and nondistended.  She  has mild tenderness in the left lower quadrant region to deep palpation.  No rebound or guarding.  No organomegaly or masses.  No abdominal bruits  or hernia.  LOWER EXTREMITIES:  No edema.   IMPRESSION:  The patient is a 40 year old lady with history of  gastroesophageal reflux disease and irritable bowel syndrome who  presents with a 2-week history of diarrhea with initially vomiting and  abdominal cramp.  These occurred 2 hours after eating  a hamburger, less  likely dealing with food poisoning.  I suspect she is having the flare  of irritable bowel syndrome, however, we need to consider the  possibility of an infectious etiology.  Gastroesophageal reflux disease  symptoms well controlled.   PLAN:  1. We will check stool for C. diff culture, O&P, and lactoferrin.  2. Align 1 daily, #30 samples provided.  3. HyoMax sublingual q.a.c., nightly, p.r.n., abdominal cramps,      diarrhea, #60 with 1 refill.  4. Based on stool studies, we may consider repeat colonoscopy early      for history of hematochezia with last colonoscopy being over 6      years ago.       Tana Coast, P.A.  Electronically Signed     R. Roetta Sessions, M.D.  Electronically Signed    LL/MEDQ  D:  01/01/2008  T:  01/01/2008  Job:  [id number]

## 2010-08-24 NOTE — H&P (Signed)
NAMESYDELL, PROWELL                  ACCOUNT NO.:  [id number]   MEDICAL RECORD NO.:  [id number]          PATIENT TYPE:  AMB   LOCATION:  DAY                           FACILITY:  [facility]   PHYSICIAN:  R. Roetta Sessions, M.D. DATE OF BIRTH:  Feb 09, 1971   DATE OF ADMISSION:  DATE OF DISCHARGE:  LH                              HISTORY & PHYSICAL   CHIEF COMPLAINT:  Acid reflux, vomiting.   HISTORY OF PRESENT ILLNESS:  The patient is a 40 year old Caucasian  female with history of gastroesophageal reflux disease and irritable  bowel syndrome.  She presents today for refractory gastroesophageal  reflux disease like symptoms and nausea and vomiting.  We last saw her  February 15, 2007, when she underwent EGD for esophageal dysphagia and  typical gastroesophageal reflux disease like symptoms.  She was noted to  have a noncritical subtle Schatzki's ring.  She was stretched with a 54  [place] Maloney dilator.  There was a little irritation of the UES mucosa  with this maneuver.  She had a tiny hiatal hernia.  The patient has been  on multiple proton pump inhibitors.  She has taken Nexium, Protonix,  Aciphex and the latest was Zegerid.  On Zegerid she notices no benefit.  She has taken Protonix sometimes up to 2 with each meal.  She has been  told not to take more than twice a day on a couple of occasions.  She  says that she has been having a lot of epigastric burning up into her  chest.  She feels like she is on fire.  This then induces vomiting.  She  has been vomiting on a daily basis.  She can only keep water down.  Her  symptoms tend to be worse in the evenings up until the last week and she  is having it throughout the day as well.  She also admits that she is  under a lot of stress as her boyfriend died suddenly due to MI about a  week ago.  She denies any BC powder although she does have it in her  medicine bag with her.  She says she has not taken any since September.  She recently was  switched from Arthrotec to Celebrex at the pain clinic  where she is being treated for chronic neck pain.  She was taking Advil  but stopped this back in February when she saw Dr. Jena Gauss.  She complains  of alternating constipation and diarrhea.  She states yesterday she  passed 3 black tarry stools.  Denies any fresh blood per rectum.   CURRENT MEDICATIONS:  1. Wellbutrin 300 mg daily.  2. Propranolol 20 mg daily.  3. Xanax 0.5 mg p.r.n.  4. Tylox one daily.  5. Celebrex 200 mg daily.  6. Protonix or Prilosec or Zegerid taking sometimes 2-3x a day      sometimes 1-2 pills at a time.  7. Phenergan p.r.n.  8. MiraLax p.r.n.   ALLERGIES:  MORPHINE causes rash.   PAST MEDICAL HISTORY:  Chronic GERD, hypertension, arthritis,  nephrolithiasis, anxiety, IBS, depression.  PAST SURGICAL HISTORY:  Last EGD as above.  She has had antral erosions  on prior EGD in 2004.  Helicobacter pylori serology has been negative.  Colonoscopy in 2003 revealed internal hemorrhoids.  She has had  bilateral knee surgery.  Surgery for ectopic pregnancy.  Bilateral tubal  ligation and partial hysterectomy in May of 2003.  Cholecystectomy in  July of 2003 and cervical disk surgery.   FAMILY HISTORY:  Mother is 97 and has thyroid disease and diabetes.  Father is 28 and has hypertension and history of myocardial infarction.  She has a sister with diabetes.  Maternal grandmother deceased secondary  to complications of pancreatic cancer.  No family history of colorectal  cancer or chronic liver disease.   SOCIAL HISTORY:  She is single.  Her boyfriend passed away a week ago  due to a massive myocardial infarction.  She has 2 children ages 37 and  68.  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  No alcohol use.   REVIEW OF SYSTEMS:  GI:  See HPI.  CONSTITUTIONAL:  No weight loss.  CARDIOPULMONARY:  Denies chest pain, shortness of breath, palpitations  or cough.   GENITOURINARY:  Denies dysuria, hematuria.   PHYSICAL EXAMINATION:  VITAL SIGNS:  Weight 143 pounds down from 154  pounds November of 2008.  Temperature 98.2, blood pressure 108/68, pulse  88.  GENERAL:  A pleasant, well-nourished, well-developed Caucasian female in  no acute distress.  SKIN:  Warm and dry.  No jaundice.  HEENT:  Sclerae nonicteric.  Oropharyngeal mucosa moist and pink.  No  lesions, erythema or exudate.  CHEST:  Lungs clear to auscultation.  CARDIAC:  Reveals regular rate and rhythm.  Normal S1, S2.  No murmurs,  rubs or gallops.  ABDOMEN:  Positive bowel sounds.  Abdomen is soft.  She has moderate  epigastric tenderness to deep palpation, mild tenderness throughout the  lower abdomen.  No rebound or guarding.  No organomegaly.  No masses.  No abdominal bruits or hernias.  RECTAL:  Reveals no masses externally.  Secretions are Hemoccult  negative.  No abnormalities in the rectal vault palpated.   IMPRESSION:  Ivon is a 40 year old lady with history of chronic  gastroesophageal reflux disease.  Recent EGD was unremarkable except for  a subtle Schatzki ring and a tiny hiatal hernia.  She has not responded  to even high dose proton pump inhibitor making it very difficult to  manage her symptoms.  I suspect there is a significant functional  overlay.  Given the amounts of proton pump inhibitor that she is taking  she ought to have adequate suppression of any acid reflux.  She also has  a history of NSAID use in the way of Advil and Goody powder but denies  any Goody powder since September and no Advil since February but  recently her Arthrotec was switched to Celebrex.  She now gives a  history of melena which is concerning.  Hemodynamically she appears  stable.  She does not appear to be significantly anemic.  I have  discussed the case with Dr. Jena Gauss.  We will pursue endoscopy because of  her refractory symptoms, vomiting and history of melena.  If EGD is   unremarkable at this time we will pursue Bravo on acid suppression.  Will also check a CBC and med7.  She will continue Protonix 40 mg b.i.d.  and will add Carafate 1 g before meals and  at bedtime as needed but she  will discontinue this at least 24 hours before her endoscopy is  scheduled.     Tana Coast, P.AJonathon Bellows, M.D.  Electronically Signed   LL/MEDQ  D:  06/13/2007  T:  06/13/2007  Job:  [id number]

## 2010-08-24 NOTE — Op Note (Signed)
Colleen Duncan, Colleen Duncan                  ACCOUNT NO.:  [id number]   MEDICAL RECORD NO.:  [id number]          PATIENT TYPE:  AMB   LOCATION:  DAY                           FACILITY:  [facility]   PHYSICIAN:  R. Roetta Sessions, M.D. DATE OF BIRTH:  Aug 17, 1970   DATE OF PROCEDURE:  02/15/2007  DATE OF DISCHARGE:                               OPERATIVE REPORT   PROCEDURE:  EGD with Colleen Duncan dilation.   INDICATIONS FOR PROCEDURE:  A 40 year old lady with recent worsening to  her typical gastroesophageal reflux disease symptoms and esophageal  dysphagia to solids in the setting of a respiratory tract infection  recently.  She has a history of longstanding aspirin powder use and used  powders as recently as a week and a half ago.  Her acid suppression  regimen has been changed around recently from Nexium to Protonix.  She  has been taking off label mega doses of Protonix, specifically taking  two 40 mg tablets before each meal three times a day without much change  in her symptoms.  She really does not have a true odynophagia.  EGD is  now being done.  This approach has been discussed with the patient at  length.  The potential risks, benefits and alternatives have been  reviewed, questions answered.  She is agreeable.  Please see the  documentation in the medical record.   PROCEDURE NOTE:  O2 saturation, blood pressure, pulse and respirations  were monitored throughout the entire procedure.  Conscious sedation with  Versed 6 mg IV, Demerol 125 mg IV, Phenergan 12.5 mg diluted slow IV  push to augment conscious sedation.  Cetacaine spray for topical  pharyngeal anesthesia.   INSTRUMENT:  Pentax video chip system.   FINDINGS:  Examination of the tubular esophagus revealed normal-  appearing mucosa down to the EG junction.  In the EG junction I did  identify a noncritical subtle Schatzki's ring seen best in the  retroflexed position from the stomach.  There again the mucosa of the  esophagus otherwise  appeared normal.  The EG junction was very easily  traversed with the scope.   STOMACH:  The gastric cavity was empty and insufflated well with air.  A  thorough examination of the gastric mucosa including retroflexion in the  proximal stomach and esophagogastric structures demonstrated a small  hiatal hernia only. The pylorus was patent and easily traversed.  Examination of the bulb and second portion revealed no abnormalities.   THERAPEUTIC/DIAGNOSTIC MANEUVERS PERFORMED:  A 54-French Maloney dilator  was passed the full insertion without resistance. A look back revealed  no apparent complications related to passage of the dilator.  There was  a little irritation of the UES mucosa with this maneuver, but no frank  tears etc.  The patient tolerated the procedure  and was a reactive endoscopy.   IMPRESSION:  1. Noncritical subtle Schatzki's ring, otherwise normal.  Tubular      esophagus status post passage of 54-French Maloney dilator.  2. Tiny hiatal hernia, otherwise normal stomach D1 and D2.   Based on the findings in my  estimation do not explain this lady's recent  symptoms.  I suspect she has more of a globus hystericus scenario now  than anything else.  Along the way she may have experienced pill or  medication-induced esophagitis which hypersensitized her esophagus.  Self limiting candida infection at this point is not excluded either.  I  do suspect significant functional overlay at this point.   RECOMMENDATIONS:  1. We will stop Protonix.  I have reviewed with her the importance of      not taking any aspirin powders particularly when she is already      taking Arthrotec.  2. Begin Aciphex 20 mg orally daily 30 minutes before breakfast.  She      is to go by my office for a months worth of free samples.  Again      she is to stop Nexium and Protonix entirely.  3. Follow-up appointment with [name] in 1 month.      Jonathon Bellows, M.D.  Electronically Signed      RMR/MEDQ  D:  02/15/2007  T:  02/15/2007  Job:  [id number]   cc:   Patrica Duel, M.D.  Fax: [phone]

## 2010-08-24 NOTE — Op Note (Signed)
NAMEAILEEN, Colleen Duncan                  ACCOUNT NO.:  [id number]   MEDICAL RECORD NO.:  [id number]          PATIENT TYPE:  AMB   LOCATION:  DAY                           FACILITY:  [facility]   PHYSICIAN:  R. Roetta Sessions, M.D. DATE OF BIRTH:  07-Feb-1971   DATE OF PROCEDURE:  06/18/2007  DATE OF DISCHARGE:                               OPERATIVE REPORT   PROCEDURE PERFORMED:  Diagnostic esophagogastroduodenoscopy followed by  Bravo pH probe placement.   INDICATIONS FOR PROCEDURE:  A 40 year old lady with refractory reflux  symptoms described as heartburn.  She has been taking various PPI daily  in excessive doses.  Sometimes five or six Protonix, Prilosec or  Zegerids daily depending on what she has on hand.  She took her last  Protonix last evening.  She has no PPI.  She has very poor insurance  benefit.  She is here for diagnostic EGD and she understands that if I  do not find any significant abnormality, we will place a Bravo pH probe  and will provide her samples to keep her on b.i.d. PPI therapy to  document efficacy of therapy over the next two days.  She has had some  reported melena recently.  Her CBC is entirely normal.  Risks, benefits,  alternatives and limitations have been reviewed.  Please see  documentation in the medical record.   PROCEDURE NOTE:  O2 saturation, blood pressure, pulse and monitor  throughout the entire procedure.  Conscious sedation Versed 6 mg IV,  Demerol 125 mg IV in divided doses.  Phenergan 25 mg diluted slow IV  push in divided doses to augment conscious sedation.  Cetacaine spray  for topical pharyngeal anesthesia.   FINDINGS:  Examination of tubular esophagus revealed entirely normal  mucosa.  EG junction identified at 37 cm from the incisors.  EG junction  easily traversed.  Stomach:  Gastric cavity was empty.  It insufflated well with air.  Thorough examination of the gastric mucosa including retroflexion in  proximal stomach, esophagogastric  junction demonstrated normal gastric  mucosa.  Pylorus patent, easily traversed.  Examination of bulb and  second portion revealed no abnormalities.   THERAPEUTIC/DIAGNOSTIC MANEUVERS PERFORMED:  The stomach was fully  insufflated.  The scope was pulled back.  The EG junction was measured  once again, was 38 cm from the incisors.  Average of 37, 38 will be  taken.  The probe will be placed 6 cm above the EG junction (i.e. 32  cm).  Scope was withdrawn.  Bravo pH probe was calibrated it was passed  blindly per orally as per usual routine.  It was deployed in standard  fashion.  A look back in the esophagus revealed the probe was attached  to the esophageal mucosa in satisfactory position.  The patient  tolerated the procedure well.   IMPRESSION:  A normal esophagus, stomach, D1 and D2, status post Bravo  pH probe placement.  Colleen Duncan is instructed to go to the office to get  some Protonix she will be given a supply of samples and it was  emphasized that  she take Protonix 40 grams orally twice daily today and  tomorrow and the next day and she will be versed on keeping up with the  diary and the equipment and we will analyze data as soon as it becomes  available.      Jonathon Bellows, M.D.  Electronically Signed     RMR/MEDQ  D:  06/18/2007  T:  06/19/2007  Job:  [id number]

## 2010-08-24 NOTE — Assessment & Plan Note (Signed)
NAMEMarland Kitchen  Colleen Duncan, Colleen Duncan                   CHART#:  [id number]   DATE:  05/29/2007                       DOB:  Jul 19, 1970   Followup globus, gastroesophageal reflux disease, dysphagia.  Last seen  on 02/15/2007 at which time she underwent EGD and had a subtle  Schatzki's ring on upper endo and a small hiatal hernia.  A 54-French  Maloney dilator was passed.  She has been out of Nexium and Protonix; in  fact, she has been off all acid-suppression agents and has had some  worsening of retrosternal burning, particularly at night.  Sometimes she  gets diarrhea at the same time.  She does not have reflux symptoms.  She  is actually doing very well.  Has not really had any abdominal pain and  minimal hematochezia.   MEDICATIONS:  See updated list.   ALLERGIES:  MORPHINE.   PHYSICAL EXAMINATION:  She appears her baseline.  Weight 148, height 5 feet 5 inches, temp 98.9, BP 122/88, pulse 80.  SKIN:  Warm and dry.  ABDOMEN:  Flat, positive bowel sounds, soft, nontender without  appreciable mass or organomegaly.   ASSESSMENT:  History of gastroesophageal reflux disease, irritable bowel  syndrome, dysphagia, are all status post passage of Maloney dilator.  She really needs to be on acid-suppression therapy indefinitely, not  taking on a regular basis is thwarting her efforts.   RECOMMENDATIONS:  Zegerid 40 mg daily.  I have given her a 3-week supply  of the suspension sample (the suspension form of samples is all we had).  She is to let us know in 3 weeks how things are going, and then we will  decide about further therapy and when she can come back for office  visits.       Jonathon Bellows, M.D.  Electronically Signed     RMR/MEDQ  D:  05/29/2007  T:  05/30/2007  Job:  [id number]   cc:   Kirk Ruths, M.D.

## 2010-08-24 NOTE — Assessment & Plan Note (Signed)
NAMEMarland Duncan  [name], [name]                   CHART#:  [id number]   DATE:  10/25/2007                       DOB:  1971/04/04   Followup gastroesophageal reflux disease, refractory symptoms, last seen  on 07/18/2007.  She had been having quite a bit of stress early in the  year over the death of her boyfriend and having to move out of her home.  We did a Bravo on therapy and this demonstrated excellent control of  acid __________ esophagus.  We added Carafate to her regimen.  Overall,  since 07/18/2007, she is now doing very well.  Her stressors have  diminished.  She switched from Wellbutrin to amitriptyline 50 mg at  bedtime (at [facility]).  This has been associated with marked  improvement in her stress levels as she describes she is sleeping very  well and really not having much in the way of reflux symptoms at all  now, on Nexium 40 g orally twice daily.   CURRENT MEDICATIONS:  See updated list.   ALLERGIES:  Morphine.   OBJECTIVE:  Appears in no acute distress today.  Weight 134, height 5  feet 5 inches, temperature 97, and BP 122/80, and pulse 62.  Detailed  exam deferred.   ASSESSMENT:  Gastroesophageal reflux disease symptoms, now doing much  better.  She continues on Nexium 40 g orally twice daily.   RECOMMENDATIONS:  1. Reviewed antireflux lifestyle/dietary measures with the patient.  2. Continue Nexium 40 g orally twice daily.  3. Unless something comes up, I plan to see this nice lady back in 1      year.       Jonathon Bellows, M.D.  Electronically Signed     RMR/MEDQ  D:  10/25/2007  T:  10/26/2007  Job:  [id number]   cc:   [facility]

## 2010-08-24 NOTE — Consult Note (Signed)
Colleen Duncan, Colleen Duncan                  ACCOUNT NO.:  [id number]   MEDICAL RECORD NO.:  [id number]          PATIENT TYPE:  AMB   LOCATION:  DAY                           FACILITY:  [facility]   PHYSICIAN:  R. Roetta Sessions, M.D. DATE OF BIRTH:  [birth year]   DATE OF CONSULTATION:  01/24/2007  DATE OF DISCHARGE:                                 CONSULTATION   CHIEF COMPLAINT:  Abdominal pain, reflux, difficulty swallowing,   REQUESTING PHYSICIAN:  Melony Overly, PAC, with Patrica Duel, M.D.   HISTORY OF PRESENT ILLNESS:  Colleen Duncan is a 40 year old lady well known to  our practice for a history of GERD and IBS.  Colleen Duncan presents today at the  request of Melony Overly, [facility], with Dr. Patrica Duel today for further  evaluation of above-stated symptoms.  Colleen Duncan states Colleen Duncan was doing  reasonably well with regards for reflux.  Colleen Duncan has been on Protonix 40 mg  daily.  Towards the end of September, Colleen Duncan states there was a virus going  around Colleen Duncan office.  Colleen Duncan developed vomiting.  Vomiting lasted for several  days and Colleen Duncan also had a lot of flare of Colleen Duncan reflux.  Colleen Duncan complains of  nocturnal burning.  Colleen Duncan wakes up in the middle of the night with acid in  Colleen Duncan mouth.  Colleen Duncan still has quite a bit of nausea but the last time Colleen Duncan  vomited was about 4 days ago.  Colleen Duncan increased Colleen Duncan Protonix, was actually  taking like 4-5 a day.  Colleen Duncan is now on Nexium 40 mg b.i.d.  Colleen Duncan still has  a lot of burning in Colleen Duncan chest.  Most for symptoms are nocturnal.  Colleen Duncan  also complains of recurrent dysphagia to solid foods and to pills.  Colleen Duncan  denies any melena or rectal bleeding, hematemesis.  Bowel movements are  every other day.  Colleen Duncan has used quite a bit of Advil and Goody's powders  but none in the last 1 month since Colleen Duncan symptoms.  Colleen Duncan does take  Arthrotec 50/0.2 mg b.i.d. for arthritis.   CURRENT MEDICATIONS:  1. Wellbutrin XL 300 mg daily.  2. Arthrotec 50/0.2 mg b.i.d.  3. Propranolol 20 mg daily.  4. Nexium 40 mg b.i.d.  5. Sucralfate 1 gram  q.i.d.  6. Xanax 0.5 mg p.r.n.  7. Tylox one daily as needed.   ALLERGIES:  MORPHINE.   PAST MEDICAL HISTORY:  1. Chronic GERD.  2. Hypertension.  3. Arthritis.  4. Nephrolithiasis.  5. Anxiety.  6. IBS.   PAST SURGICAL HISTORY:  1. Colleen Duncan last EGD was in 2004 and Colleen Duncan had antral erosions.  Recently had      negative H. pylori serologies.  Colleen Duncan has had multiple EGDs, but in      1998 had a 54-French Maloney dilator passed due to history of      dysphagia.  2. Colleen Duncan has had a colonoscopy which was normal except for internal      hemorrhoids in 2003.  3. Colleen Duncan has had bilateral knee surgery.  4. Surgery for ectopic pregnancy.  5. Bilateral tubal ligation.  6. Partial  hysterectomy in May 2003.  7. Cholecystectomy in July 2003.  8. Cervical disk surgery.   FAMILY HISTORY:  Mother is 22 and has thyroid disease and diabetes.  Father is 70, hypertension and a history of MI.  Colleen Duncan has a sister with  diabetes.  Colleen Duncan had a maternal grandmother deceased secondary to  complications of pancreatic cancer.  No family history of colorectal  cancer or chronic liver disease.   SOCIAL HISTORY:  Colleen Duncan is single.  Colleen Duncan has two children.  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  Colleen Duncan never been a smoker.  No alcohol use.   REVIEW OF SYSTEMS:  See HPI for GI.  CONSTITUTIONAL:  Denies weight  loss.  CARDIOPULMONARY:  Denies chest pain, shortness of breath,  palpitations, cough.  GENITOURINARY:  Denies dysuria or hematuria.   PHYSICAL EXAM:  VITAL SIGNS:  Weight was 154, height 5 feet 6.  Temperature 98.7, blood pressure 118/80, pulse 72.  GENERAL:  Pleasant, well-nourished, well-developed Caucasian female in  no acute distress.  SKIN:  Warm and dry.  No jaundice.  HEENT:  Sclerae nonicteric.  Oropharyngeal mucosa moist and pink.  No  lesions, erythema or exudate.  No lymphadenopathy, thyromegaly.  CHEST:  Lungs are clear to auscultation.  CARDIAC:  Exam reveals regular rate and  rhythm.  Normal S1-S2, no  murmurs, rubs or gallops.  ABDOMEN:  Positive bowel sounds.  Abdomen is soft.  Colleen Duncan has mild diffuse  abdominal tenderness to deep palpation.  No rebound tenderness or  guarding.  No organomegaly or masses.  No abdominal bruits or hernias.  EXTREMITIES:  No edema.   IMPRESSION:  Colleen Duncan is a 40 year old lady who complains of approximately 1-  month history of a flare of Colleen Duncan gastroesophageal reflux disease.  Colleen Duncan  states Colleen Duncan symptoms began after Colleen Duncan developed a virus with several  episodes of vomiting.  Colleen Duncan also complains of dysphagia to pills with  solid foods.  Colleen Duncan has a history of nonsteroidal anti-inflammatory drug  use in the way of Advil and Goody's powders up until about 1 month ago.  Colleen Duncan is on Arthrotec twice a day as well.  Colleen Duncan is on a b.i.d. PPI without  any improvement in Colleen Duncan symptoms.  Colleen Duncan needs the  esophagogastroduodenoscopy to further evaluate Colleen Duncan symptoms and to rule  out complicated gastroesophageal reflux disease.   PLAN:  EGD with possible esophageal dilatation in the near future with  Dr. Jena Gauss. I would like to thank Melony Overly for allowing [name] to take  part in the care of this patient.      Tana Coast, P.AJonathon Bellows, M.D.  Electronically Signed    LL/MEDQ  D:  01/24/2007  T:  01/25/2007  Job:  [id number]   cc:   Melony Overly, PA

## 2010-08-24 NOTE — Assessment & Plan Note (Signed)
NAMEMarland Kitchen  SHAUNTAE, REITMAN                   CHART#:  [id number]   DATE:  07/18/2007                       DOB:  [birth year]   CHIEF COMPLAINT:  Followup of reflux.   SUBJECTIVE:  Colleen Duncan is here for a followup visit.  She was last seen at  time of EGD and Bravo placement back in March.  On EGD, she had a normal  study.  Forty-eight-hour Bravo revealed extremely good control of GERD  while on acid suppression.  It was felt that the patient had nonerosive  reflux esophagitis and likely an acid sensitive of esophagus with  significant functional overlay, especially in the setting of sudden  death of her boyfriend.  Patient is currently on Nexium 40 mg b.i.d.  She is taking some Carafate p.r.n. as well.  If she gets breakthrough  symptoms, she will take this and it seems to provide good relief.  She  says she is about 50%-75% better.  She denies any abdominal pain.  Her  bowel movements are regular.  Her weight is down another 7 pounds.  She  states she did not have an appetite due to her grief.  All of her  symptoms seem to be worse in the evenings.   CURRENT MEDICATIONS:  See updated list.   ALLERGIES:  MORPHINE.   PHYSICAL EXAMINATION:  VITAL SIGNS:  Weight 136, down 7 pounds.  Temperature 98.3.  Blood pressure 138/78.  Pulse 80.  GENERAL:  Pleasant, well-nourished, well-developed Caucasian female in  no acute distress.  SKIN:  Warm and dry, no jaundice.  HEENT:  Sclerae nonicteric.  Oropharyngeal mucosa moist and pink.  ABDOMEN:  Positive bowel sounds.  Abdomen soft.  She has minimal  epigastric tenderness to deep palpation.  No rebound or guarding.  No  abdominal bruits or hernias.  LOWER EXTREMITIES:  No edema.   IMPRESSION:  Kerrilyn is a 40 year old lady with complaints of retrosternal  burning and pressure, especially in the evenings.  Recent Bravo revealed  that she had no significant acid reflux, at least in the setting of  b.i.d. proton pump inhibitor therapy.  She has noted some  improvement in  her symptoms and I suspect she does have functional overlay in the  setting of the sudden death of her boyfriend recently.  As she has shown  an improvement, we will continue course of the therapy and monitor her  for now.  However, if she does not continue to improve that she should  let us know and continue any further workup as needed.   PLAN:  1. Continue Nexium b.i.d.  2. Carafate 1 gram caplets p.o. q. before meals q. at bedtime p.r.n.      #2 week supply zero      refills.  3. Office visit with Dr. Jena Gauss in 2 months to assess her progress.       Tana Coast, P.A.  Electronically Signed     Kassie Mends, M.D.  Electronically Signed    LL/MEDQ  D:  07/18/2007  T:  07/18/2007  Job:  [id number]   cc:   Kirk Ruths, M.D.

## 2010-08-24 NOTE — Op Note (Signed)
NAME:  Colleen Duncan, Colleen Duncan NO.:  [id number]   MEDICAL RECORD NO.:  [id number]          PATIENT TYPE:  AMB   LOCATION:  DSC                          FACILITY:  [facility]   PHYSICIAN:  Sharolyn Douglas, M.D.        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   PROCEDURE:  Bilateral C5-6 facette joint injections with fluoroscopy  used to provide needle localization.   SURGEON:  Sharolyn Douglas, M.D.   ASSISTANT:  None.   ANESTHESIA:  MAC plus local.   COMPLICATIONS:  None.   ESTIMATED BLOOD LOSS:  None.   INDICATIONS:  The patient is a 40 year old female with chronic  persistent neck pain status post previous ACDF at C6-7 by another  surgeon.  She now presents for diagnostic and potentially therapeutic  facette joint injections above the fusion at the C5-6.  Risks, benefits,  alternatives were reviewed.  We will consider doing a right C6 nerve  root block based on the response to the facette injections.   PROCEDURE:  After informed consent, she was taken to the operating room.  She was turned prone.  The neck was prepped, draped in usual sterile  fashion.  She underwent sedation by anesthesia. Fluoroscopy was brought  into the field and the C5-6 facette joints were visualized.  22 gauge  Quincke spinal needles were advanced into the facette joints bilaterally  at C5-6 using the fluoroscopy.  Aspiration showed no blood.  Injection  of 40 mg Depo-Medrol 1 mL 1% preservative-free lidocaine into each  facette joint bilaterally.  The patient tolerated the procedure well,  transferred to recovery in stable condition.  I reviewed post injection  instructions with her, follow-up in 2-3 weeks.      Sharolyn Douglas, M.D.  Electronically Signed     MC/MEDQ  D:  09/26/2006  T:  09/26/2006  Job:  [id number]

## 2010-08-27 NOTE — H&P (Signed)
NAME:  Colleen Duncan, Colleen Duncan                            ACCOUNT NO.:  [id number]   MEDICAL RECORD NO.:  [id number]                   PATIENT TYPE:   LOCATION:                                       FACILITY:   PHYSICIAN:  R. Roetta Sessions, M.D.              DATE OF BIRTH:  1970/10/27   DATE OF ADMISSION:  DATE OF DISCHARGE:                                HISTORY & PHYSICAL   CHIEF COMPLAINT:  Nausea, vomiting, abdominal pain, acid reflux,  constipation, diarrhea.   HISTORY OF PRESENT ILLNESS:  The patient is a 40 year old Caucasian female,  patient of Dr. Regino Schultze, who was recently seen by Dr. Jena Gauss in the office on  December 11, 2002, for the above-stated symptoms.  She was found to be  taking Goody's powders and asked to discontinue this.  There was concern  that she may have a peptic ulcer.  She had blood work which revealed a mild  anemia with a hemoglobin of 11.3, hematocrit 34.5, normal MCV at 83.4.  LFTs, amylase were normal.  She also had a negative urinalysis.  She had a  CT of the abdomen and pelvis which revealed tiny hepatic cysts, tiny  bilateral renal calculi without hydronephrosis, status post hysterectomy,  and incidental vertebral hemangioma in the L1 vertebral body which was felt  to be without clinical significance.  There was nothing to explain her  abdominal pain.  The patient tells me today that she continues to have  intermittent nausea and vomiting.  The patient last vomited three nights  ago.  She has noticed that symptoms seem to be worse during the week when  she is working.  Denies any hematemesis.  She often does not eat at  nighttime because of nausea and vomiting.  She has tried to drink only  water.  Denies any alcohol use.  She continues to take three to four Goody's  powders per week.  She states she tried to stop for one week but had  problems with headaches.  She did not go back to her primary care physician,  stating that they only give her Xanax for her  headaches.  She has lost four  pounds since we last saw her.  She continues to have alternating  constipation and diarrhea but now more diarrheal.  She uses NuLev with good  results.  She is having three to four stools postprandially daily.  She  continues to have breakthrough heartburn symptoms, especially postprandially  and nocturnally.  She states her stools are very dark.  Denies any gross  hematuria.  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  Denies any bright  red blood per rectum.   CURRENT MEDICATIONS:  1. Propranolol 20 mg b.i.d.  2. Xanax 0.5 mg p.r.n.  3. Goody's powders 3-4 weekly.  4. Protonix 40 mg  daily.  5. Phenergan 25 mg, 1/2 tablet p.r.n.  6. NuLev p.r.n.   ALLERGIES:  No known drug allergies.   PAST MEDICAL HISTORY:  1. Hypertension.  2. History of nephrolithiasis with bilateral tiny renal stones bilaterally.     She has had to have kidney stone extraction in the past.  3. History of chronic headaches.  4. Anxiety.  5. Gastroesophageal reflux disease.  6. IBS.  7. Status post bilateral knee surgery.  8. Surgery for ectopic pregnancy.  9. Bilateral tubal ligation.  10.      Partial hysterectomy May 2003.  11.      Cholecystectomy July 2003.  12.      The patient had a normal EGD with empirical dilatation with 54-     French Maloney dilator, normal colonoscopy except for internal     hemorrhoids back in 2003, by Dr. Jena Gauss.   FAMILY HISTORY:  Mother is alive and has a history of cancer, possibly  lymphoma, diagnosed and treated when she was 63 years old.  Father has  emphysema, hypertension.  No family history of colorectal cancer, chronic  liver disease.  Maternal grandmother deceased secondary to complications of  pancreatic cancer.   SOCIAL HISTORY:  She is married.  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  She does use any tobacco.  Denies any alcohol   use.  She races go-carts on the weekends.   REVIEW OF SYSTEMS:  Please see HPI for GI.  GENERAL:  Lost a few pounds  since we last saw her.  CARDIOPULMONARY:  Denies any chest pain or shortness  of breath.  GENITOURINARY:  Denies any chronic gross hematuria or dysuria.   PHYSICAL EXAMINATION:  VITAL SIGNS:  Weight 138.5.  Blood pressure 110/70,  pulse 84.  GENERAL:  Pleasant, well-nourished, well-developed Caucasian female in no  acute distress.  SKIN:  Warm and dry.  No jaundice.  HEENT:  Conjunctivae are pink.  Sclerae are nonicteric.  Oropharyngeal  mucosa moist and pink.  CHEST:  Lungs clear to auscultation.  CARDIAC:  Regular rate and rhythm.  Normal S1, S2.  No murmurs, rubs, or  gallops.  ABDOMEN:  Positive bowel sounds.  Soft and nondistended.  She has mild  tenderness throughout the entire abdomen to deep palpation.  Increased  tenderness in the epigastric, right upper quadrant region to deep palpation.  No organomegaly or masses.  No rebound tenderness or guarding.  EXTREMITIES:  No edema.   IMPRESSION:  1. Intermittent nausea and vomiting associated with right upper     quadrant/epigastric abdominal pain.  This is in the setting of chronic     nonsteroidal anti-inflammatory drug use.  At this point I am concerned     she may have peptic ulcer disease.  She recently was noted to have a mild     anemia but has had no frank gastrointestinal bleeding.  2. Chronic gastroesophageal reflux disease, symptoms poorly controlled on     Protonix.  3. Irritable bowel syndrome.   PLAN:  1. EGD in the near future.  2.     Will check another H&H at the time of endoscopy.  3. She was advised to discontinue NSAID use, as before.  4. Continue Protonix for now.     _____________________________________  ___________________________________________  Tana Coast, P.AJonathon Bellows, M.D.  LL/MEDQ  D:  01/13/2003  T:  01/13/2003  Job:  [id number]   cc:   Kirk Ruths, M.D.  [address]  [place]  [place] [address]  Fax: [phone]

## 2010-08-27 NOTE — Op Note (Signed)
Colleen Duncan, HEMING                  ACCOUNT NO.:  [id number]   MEDICAL RECORD NO.:  [id number]          PATIENT TYPE:  AMB   LOCATION:  DAY                           FACILITY:  [facility]   PHYSICIAN:  R. Roetta Sessions, M.D. DATE OF BIRTH:  Dec 14, 1970   DATE OF PROCEDURE:  DATE OF DISCHARGE:  06/18/2007                               OPERATIVE REPORT   PROCEDURE PERFORMED:  Bravo ambulatory esophageal pH study.   INDICATIONS FOR PROCEDURE:  40 year old lady with apparent refractory  gastroesophageal reflux disease she describes as heartburn.  She has  been on multiple various proton pump inhibitor agents on her own taking  Protonix, Prilosec, or Zegerid, sometimes 2-3 times daily, 1-2 pills or  capsules at a time.  This study is being done on Protonix 40 mg orally  twice daily.  Please see the EGD report for details of probe placement.   FINDINGS:  Day one, the number reflux episodes of 6, the number of  longest reflux episode greater than 5 minutes is 0, the duration of  longest reflux in minutes 0, time pH less than 4 minutes 1, fraction  time pH less than 4, 0.1.  DeMeester score is at 1 with DeMeester  normals less than 14.72.  Day two analysis, number reflux episodes 3,  number of longest reflux episodes greater than 5 minutes 0, duration of  longest reflux in minutes 1, time pH less than 4 minutes 1, fraction  time pH less than 4, 0.1, DeMeester score 0.7, DeMeester normals less  than 14.72.   In reviewing the patient's diary, she sited multiple episodes of  heartburn, regurgitation, and chest pain which did not correlate with  episodes of gastroesophageal esophageal reflux.   IMPRESSION:  The Bravo pH probe study indicates extremely good control  of gastroesophageal reflux while on acid suppression therapy.  The  patient has symptoms far out of proportion to objective findings to go  with reflux and this lady has nonerosive reflux disease and likely has  an acid sensitive  esophagus and I suspect a significant functional  overlay. Symptoms have worsened recently with the sudden death of her  boyfriend.   RECOMMENDATIONS:  Will continue Protonix 40 mg orally b.i.d.  This nice  lady is already taking Phenergan, Tylox, Xanax, propranolol, and  Wellbutrin. We will plan to see her back in the office in 3-4 weeks and  reassess her symptoms.  I think her treatment will likely be  challenging.      Jonathon Bellows, M.D.  Electronically Signed     RMR/MEDQ  D:  06/26/2007  T:  06/26/2007  Job:  [id number]

## 2010-08-27 NOTE — Discharge Summary (Signed)
[facility]  Patient:    Colleen Duncan, Colleen Duncan Visit Number: [id number] MRN: [id number]          Service Type: SUR Location: 4A A426 01 Attending Physician:  Jeri Cos. Dictated by:   Langley Gauss, M.D. Admit Date:  07/05/2001 Discharge Date: 07/07/2001                             Discharge Summary  PROCEDURE PERFORMED:  Vaginal hysterectomy.  INDICATIONS:  Dysmenorrhea, menorrhagia, dyspareunia, irregular menses.  PERTINENT LABORATORY DATA:  Admission hemoglobin/hematocrit 10.0/28.6 with a white count of 5.2. Postoperative day #1 hemoglobin 9.0, hematocrit 25.9 with a white count within a normal range. Beta hCG is negative. Electrolytes within normal limits.  DISCHARGE MEDICATIONS: 1. Tylox. 2. Hemocyte-F dispense #30 with no refill. 3. MetroGel intravaginal should vaginal odor occur.  BLOOD TYPE:  A negative.  HOSPITAL COURSE:  Two previous dictations. The patient had an uncomplicated vaginal hysterectomy performed on July 05, 2001. Both a Foley catheter was left in place as well as an intravaginal pack. Foley catheter was removed on postoperative day #1 or July 06, 2001, at which time patient was noted to have excellent urine output of clear yellow urine. Vital signs likewise remained stable. Vaginal packing was removed also on July 06, 2001, was noted only to be minimally stained with vaginal blood. The patient did, however, on July 05, 2001 experience significant nausea and vomiting in the a.m., experienced severe headache, and appeared very ill and lethargic on examination. Likewise, she was known to be anemic with a hemoglobin of 9.0 and a hematocrit of 25.9, thus with her inability to tolerate any p.o. medication intake, the patient was admitted at that time and continued hospitalization until July 07, 2001 with the IV fluids administered. The patient was treated with IV Buprenex as well as IV Phenergan for symptomatic relief.  The patient progressed well to July 07, 2001 at which time she was able to tolerate p.o. Tylox for pain and relief. Vital signs again remained stable. The patient was markedly more ambulatory with a passage of flatus. Thus discharged home on July 07, 2001. Dictated by:   Langley Gauss, M.D. Attending Physician:  Jeri Cos. DD:  07/08/01 TD:  07/08/01 Job: [id number]

## 2010-08-27 NOTE — Op Note (Signed)
NAME:  Colleen Duncan, Colleen Duncan                            ACCOUNT NO.:  [id number]   MEDICAL RECORD NO.:  [id number]                   PATIENT TYPE:  AMB   LOCATION:  NESC                                 FACILITY:  [facility]   PHYSICIAN:  Lindaann Slough, M.D.               DATE OF BIRTH:  03-25-1971   DATE OF PROCEDURE:  09/01/2003  DATE OF DISCHARGE:                                 OPERATIVE REPORT   PREOPERATIVE DIAGNOSIS:  Chronic pelvic pain.   POSTOPERATIVE DIAGNOSES:  1. Chronic pelvic pain.  2. Interstitial cystitis.   OPERATION/PROCEDURE:  1. Cystoscopy.  2. Urethral dilation.  3. Hydraulic bladder distention.   SURGEON:  Lindaann Slough, M.D.   ANESTHESIA:  General.   INDICATIONS:  The patient is a 40 year old female who has been complaining  of pelvic pain on and off for the past six months.  She had urethral  dilation done about two weeks ago by Dr. Jerre Simon in [place].  However, she  continued to complain of pelvic pain.  She was seen in the office a week ago  and was treated with analgesics and she returned to the office two days  later complaining of severe pain.  She is scheduled today for cystoscopy.   DESCRIPTION OF PROCEDURE:  Under general anesthesia, the patient was prepped  and draped and placed in the dorsal lithotomy position.  A #22 Wappler  cystoscope was inserted in the bladder.  There was no stone or tumor in the  bladder and the ureteral orifices are in normal position and shape with  clear efflux.  There is evidence of submucosal hemorrhage after filling of  the bladder again.  The bladder capacity is about 650 mL.  The cystoscope  was then removed.  The urethra was then dilated with #30-French.  Then 400  mg of Pyridium and 15 mL of 0.5% Marcaine were instilled in the bladder.   The patient tolerated the procedure well and left the OR in satisfactory  condition to post anesthesia care unit.                                               Lindaann Slough,  M.D.    MN/MEDQ  D:  09/01/2003  T:  09/01/2003  Job:  [id number]

## 2010-08-27 NOTE — Op Note (Signed)
NAMEKAILLY, RICHOUX NO.:  [id number]   MEDICAL RECORD NO.:  [id number]          PATIENT TYPE:  OIB   LOCATION:  3035                         FACILITY:  [facility]   PHYSICIAN:  Cristi Loron, M.D.DATE OF BIRTH:  05/07/1970   DATE OF PROCEDURE:  04/25/2005  DATE OF DISCHARGE:                                 OPERATIVE REPORT   PREOPERATIVE DIAGNOSIS:  C6-7 herniated nucleus pulposus, spondylosis,  stenosis, cervical radiculopathy, cervicalgia.   POSTOPERATIVE DIAGNOSIS:  C6-7 herniated nucleus pulposus, spondylosis,  stenosis, cervical radiculopathy, cervicalgia.   OPERATION PERFORMED:  C6-7 extensive anterior cervical  diskectomy/decompression; interbody iliac crest allograft arthrodesis,  anterior cervical plating (Codman Slim Lock titanium plate and screws).   SURGEON:  Cristi Loron, M.D.   ASSISTANT:  Hilda Lias, M.D.   ANESTHESIA:  General endotracheal.   ESTIMATED BLOOD LOSS:  50 mL.   SPECIMENS:  None.   DRAINS:  None.   COMPLICATIONS:  None.   INDICATIONS FOR PROCEDURE:  The patient is a 40 year old white female who  suffered from neck and arm pain.  She failed medical management and was  worked up with cervical MRI which demonstrated she had significant disk  herniation/spondylosis at C6-7 with spinal stenosis.  I discussed the  various treatment options with the patient including surgery.  The patient  has weighed the risks, benefits and alternatives to surgery and decided to  proceed with the C6-7 anterior cervical diskectomy, fusion and plating.   DESCRIPTION OF PROCEDURE:  The patient was brought to the operating room by  the anesthesia team.  General endotracheal anesthesia was induced.  The  patient remained in supine position.  A roll was placed under the shoulders  to place the neck in slight extension.  The anterior cervical region was  then prepared with Betadine scrub and Betadine solution.  Sterile drapes  were  applied.  I then injected the area to be incised with Marcaine with  epinephrine solution, used a scalpel to make a transverse incision in the  patient's left anterior neck.  I used Metzenbaum scissors to divide the  platysma muscle and then to dissect medial to the sternocleidomastoid  muscle, jugular vein and carotid artery.  I carefully dissected down toward  the anterior cervical spine, carefully identifying the esophagus and  retracting it medially.  I used the Kitner swabs to clear the soft tissue  from the anterior cervical spine and then we inserted a bent spinal needle  at the upper exposed intervertebral disk space.  We then obtained  intraoperative radiograph to confirm our location.   We then used electrocautery to detach the medial border of the longus colli  muscle bilaterally from the C6-7 intervertebral disk space.  We then  inserted a Caspar self-retaining retractor underneath the longus colli  muscle bilaterally at C6-7 for exposure.   We then incised the C6-7 intervertebral disk with a 15 blade scalpel and  performed a partial diskectomy using pituitary forceps and the Carlens  curets.  We inserted distracted the C6-7 interspace with distractions screws  and then used a  high speed drill to decorticate vertebral end plates at Z6-1  using a high speed drill.  We used a drill to remove the remainder of  the  C6-7 intervertebral disk and to drill away some posterior spondylosis and  then to thin out the posterior longitudinal ligament.  We then incised the  ligament with the arachnoid knife and then removed it with the Kerrison  punch undercutting the vertebral end plates at W9-U0, decompressing the  thecal sac.  I then performed a generous foraminotomy about the bilateral C7  nerve root, completing the decompression.   We now turned our attention to arthrodesis.  We obtained iliac crest  tricortical allograft bone graft and fashioned it to these approximate  dimensions.   7 mm in height, 1 cm in depth.  We inserted the bone graft into  the distracted C6-7 interspace.  I then removed distraction screws.  There  is good snug fit of the bone graft.   We now turned our attention to the instrumentation.  We used a high speed  drill to remove some of the spondylosis from the vertebral end plates at C6-  7 so that the plate will lie down flat.  We then selected the appropriate  length Codman Slim Lock anterior cervical plate and laid it along the  anterior aspect of the C6-7.  We then drilled two 12 mm holes at C6, two at  C7.  We then secured the plate over the vertebral bodies by placing two 12  mm self-tapping screws at C6, two at C7.  We then obtained an intraoperative  radiograph to evaluate the instrumentation.  It looked good.  We then  secured the screws and plate by locking this cam.   We then obtained stringent hemostasis using bipolar electrocautery.  We  copiously irrigated the wound out with bacitracin solution, removed the  solution and then removed the self-retaining retractor.  We inspected the  esophagus for any damage.  There was none apparent.  We then reapproximated  the patient's platysma muscle with interrupted 3-0 Vicryl sutures,  subcutaneous tissues with interrupted 3-0 Vicryl suture and the skin with  benzoin and Steri-Strips.  The wound was then coated with bacitracin  ointment.  A sterile dressing was applied.  The drapes were removed.  The  patient was subsequently extubated by the anesthesia team and transported to  the post anesthesia care unit in stable condition.  All sponge, needle and  instrument counts were correct at the end of this case.      Cristi Loron, M.D.  Electronically Signed     JDJ/MEDQ  D:  04/25/2005  T:  04/26/2005  Job:  [id number]

## 2010-08-27 NOTE — H&P (Signed)
[facility]  Patient:    Colleen Duncan, Colleen Duncan Visit Number: [id number] MRN: [id number]          Service Type: SUR Location: 4A A426 01 Attending Physician:  Jeri Cos. Dictated by:   Langley Gauss, M.D. Admit Date:  07/05/2001 Discharge Date: 07/07/2001   CC:         Langley Gauss, M.D., Fax [phone]   History and Physical  HISTORY OF PRESENT ILLNESS:  This is a 40 year old gravida 2, para 2, with two prior vaginal deliveries, who is admitted for vaginal hysterectomy.  Patients primary diagnoses are that of irregular menstrual periods, #2 is dysmenorrhea with significant uterine cramping as well as associated back pain with menses and #3 - deep penetration dyspareunia.  The patient pertinently typically has menstrual periods on a monthly basis but is experiencing spotting, both preceding and after the menses.  She typically experiences four to five days of very heavy flow with the menses associated with severe dysmenorrhea, as described by the patient.  She frequently will require utilization of two or three pads at a time in an effort to prevent menstrual-type accidents. Patient does experience PMS prior to the onset of the menses with irritability as her primary complaint.  Most pertinently, patient has previously taken Ponstel, an NSAID, x4 days duration total with recent menses; this has produced no improvement in either the number of days of flow, quantity of flow or in the cramping experienced.  Patient has had prior tubal ligation.  She adamantly declines any trial of birth control pills for hormonal contraception, as she has previously experienced significant nausea with their previous use.  Patients menses have at times experienced irregular menstrual periods.  Most recently, in February, she experienced a menstrual flow from May 16, 2001 to May 20, 2001 and then again onset of bleeding from June 02, 2001 to June 08, 2001.   Patient does state that taking into account her PMS, spotting prior to and post menses, as well as the severe dysmenorrhea appreciated with the menstrual periods, patient describes her month as obtaining about one good week where she is not sick.  PAST MEDICAL HISTORY:  She had two prior vaginal deliveries without complication.  Patient does have significant acid reflux disease, has recently had both endoscopy and colonoscopy performed; her esophagus was stretched during this operative procedure.  Patient states at this point in time she does have a single right kidney stone which is very high up but is noted to be asymptomatic.  Patient does provide a history of frequent UTIs, however, these are symptomatically based only and patient states she frequently does not seek care for them as they do take care of themselves.  Patient is noted to have a history of irritable bowel syndrome as well as occasional problems with chronic constipation.  SURGICAL HISTORY:  Patient has had prior laparoscopic tubal ligation utilizing bipolar cautery; this was performed December 1996.  The operative procedure was performed without complication.  The uterus was noted to be normal in size, shape and consistency, only with a prominent fundal portion noticed.  CURRENT MEDICATIONS: 1. Zelnorm 6 mg p.o. q.d. 2. Protonix 40 mg p.o. q.d. 3. Propranolol for mild hypertension. 4. Xanax on a p.r.n. basis.  ALLERGIES:  Patient describes no known drug allergies.  PHYSICAL EXAMINATION:  GENERAL:  Very healthy, pleasant-appearing white female.  Weight is 130 pounds.  Height is 62 inches.  VITAL SIGNS:  Blood pressure 136/96.  HEENT:  Negative.  There  is no adenopathy.  NECK:  Supple.  Thyroid is nonpalpable.  LUNGS:  Clear.  CARDIOVASCULAR:  Regular rate and rhythm.  ABDOMEN:  Soft and nontender.  No surgical scars are identified other than prior laparoscopy.  EXTREMITIES:  The extremities are noted  to be normal.  PELVIC:  Exam reveals normal external genitalia, no lesions or ulcerations identified, normal external vulva.  The cervix is noted to be multiparous in appearance with moderate descensus noted with gentle traction.  Bimanual examination reveals uterus to be retroflexed, multiparous in size with a very prominent fundal portion identified.  The adnexa are palpably normal.  There is mild tenderness to manipulation of the uterus.  With deep manipulation in the cul-de-sac area, patient is noted to experience sensitivity and discomfort associated with manipulation of the retroflexed uterus.  There was minimal cystocele noted.  There was no urethral detachment, no rectocele identified.  ASSESSMENT:  [age]-year-old gravida 2, para 2, prior tubal ligation, now with incapacitating dysmenorrhea with menorrhagia.  Patient likewise experiences deep penetration dyspareunia.  Most pertinently is in her history that out of the month, she experiences only one relatively symptomatic-free week.  Patient has previously tried appropriate high-dose nonsteroidal anti-inflammatory drugs with no improvement in her symptoms.  She declines a trial of oral contraceptives for menstrual regulation as she has previously experienced significant nausea and headaches with their use.  PLAN:  Plan at this time is to proceed with definitive surgical management. Patient should be an excellent candidate for vaginal hysterectomy and, in addition, would like her ovaries to be left in place for continued ovarian functioning.  Pertinently, patient has no prior history of ovarian cyst, nor does she complain of any significant adnexal-type pain. Dictated by:   Langley Gauss, M.D. Attending Physician:  Jeri Cos. DD:  07/03/01 TD:  07/03/01 Job: [id number]

## 2010-08-27 NOTE — Op Note (Signed)
   NAMEDALEXA, GENTZ NO.:  [id number]   MEDICAL RECORD NO  [id number]                     PATIENT TYPE:   LOCATION:                                       FACILITY:   PHYSICIAN:  Dirk Dress. Katrinka Blazing, M.D.                DATE OF BIRTH:  [birth year]   DATE OF PROCEDURE:  DATE OF DISCHARGE:  11/03/2001                                 OPERATIVE REPORT   PREOPERATIVE DIAGNOSIS:  Chronic cholecystitis.   POSTOPERATIVE DIAGNOSIS:  Chronic cholecystitis.   PROCEDURE:  Laparoscopic cholecystectomy.   SURGEON:  Dirk Dress. Katrinka Blazing, M.D.   DESCRIPTION OF PROCEDURE:  Under general anesthesia, the abdomen was prepped  and draped in a sterile field.  A 10 mm midline incision was made.  The  Veress needle was inserted uneventfully.  The abdomen was insufflated with 2  L of CO2.  Using a Visiport guide, a 10 mm port was placed without  difficulty.  Under videoscopic guidance a 10 mm port was positioned.  The  laparoscope was placed and the gallbladder was visualized.  The patient was  placed in reverse Trendelenburg position and tilted to the left.  A  transverse incision was made through the right upper quadrant paramedian  position.  A 10 mm port was placed under videoscopic guidance.  Two lateral  5 mm ports were placed under videoscopic guidance.  The gallbladder was  grasped and positioned.  The cystic artery was dissected, clipped with four  clips, and divided.  The cystic duct was dissected, then clipped with five  clips and divided.  Using electrocautery, the gallbladder was separated from  the intrahepatic space without difficulty.  The gallbladder was grasped and  retrieved intact.  There was minimal blood loss with no measurable loss  noted.  Hemostasis in the bed was complete.  There was some bilious  drainage.  Irrigating fluid was totally clear.  Inspection of the right  upper quadrant, the subhepatic space, the liver, the hepatic flexure of the  colon, and  the ascending colon all were normal.  The stomach appeared  unremarkable.  The patient tolerated the procedure well.  CO2 was allowed to  escape from the abdomen, and the ports were removed.  The incisions were  closed using 0 Dexon on the fascia and staples on the skin.  Dressings were  placed.  She was awakened from anesthesia uneventfully, transferred to her  bed, and taken to the postanesthetic care unit.                                                Dirk Dress. Katrinka Blazing, M.D.    LCS/MEDQ  D:  11/02/2001  T:  11/08/2001  Job:  [id number]

## 2010-08-27 NOTE — Op Note (Signed)
   NAME:  Colleen Duncan, Colleen Duncan                            ACCOUNT NO.:  [id number]   MEDICAL RECORD NO.:  [id number]                   PATIENT TYPE:  AMB   LOCATION:  DAY                                  FACILITY:  [facility]   PHYSICIAN:  Ky Barban, M.D.            DATE OF BIRTH:  05/14/1970   DATE OF PROCEDURE:  05/31/2002  DATE OF DISCHARGE:                                 OPERATIVE REPORT   PREOPERATIVE DIAGNOSIS:  Right ureteral calculus.   POSTOPERATIVE DIAGNOSIS:  Right ureteral calculus.   PROCEDURES:  Cystoscopy, right retrograde pyelogram, ureteroscopic stone  basket, holmium laser lithotripsy, insertion of double J stent size 5  [place], 24 cm.   ANESTHESIA:  General.   DESCRIPTION OF PROCEDURE:  Patient given general anesthesia, placed in  lithotomy position, the usual prep and drape.  A #25 cystoscope introduced  into the bladder.  It was inspected and looks normal.  The right ureteral  orifice catheterized with the wedge catheter and Hypaque injected.  Under  fluoroscopic control, the stone is seen just above the uterovesical  junction.  At this point a guidewire is passed up into the renal pelvis and  the intramural ureter is dilated with #10 balloon.  Now the balloon is  removed, leaving the guidewire in place, and the ureteroscope, short rigid,  is introduced alongside the guidewire.  I went to the level fo the stone and  the stone is treated with the holmium laser, and it is completely shattered.  Now I introduce the stone basket and the stone is engaged under direct  vision and after several passes, all the pieces of the stone were taken out.  Some of the pieces fell into the bladder, some were taken out.  The ureter  at the end was inspected, looks fine.  I inserted a #24 cm, 5 [place] double  J stent under fluoroscopic control.  The position of the stent is visualized  with fluoroscopy, looks fine.  All the instruments were removed.  The  patient left the operating  room in satisfactory condition.                                               Ky Barban, M.D.    MIJ/MEDQ  D:  05/31/2002  T:  05/31/2002  Job:  [id number]

## 2010-08-27 NOTE — Op Note (Signed)
[facility]  Patient:    Colleen Duncan, Colleen Duncan Visit Number: [id number] MRN: [id number]          Service Type: SUR Location: 4A A426 01 Attending Physician:  Jeri Cos. Dictated by:   Langley Gauss, M.D. Proc. Date: 07/05/01 Admit Date:  07/05/2001 Discharge Date: 07/07/2001                             Operative Report  PREOPERATIVE DIAGNOSES: 1. Dysmenorrhea. 2. Irregular menstrual periods. 3. Deep penetration dyspareunia.  POSTOPERATIVE DIAGNOSES: 1. Dysmenorrhea. 2. Irregular menstrual periods. 3. Deep penetration dyspareunia.  PROCEDURE:  Vaginal hysterectomy.  SURGEON:  Langley Gauss, M.D.  ESTIMATED BLOOD LOSS:  100 cc.  COMPLICATIONS:  None.  ANESTHESIA:  General endotracheal anesthesia.  SPECIMENS:  To pathology for permanent section only.  FINDINGS:  Excellent uterine descensus.  Small uterine fundus noted to be somewhat bulbous in appearance.  Ovaries well visualized and noted to be normal in appearance.  DESCRIPTION OF PROCEDURE:  The patient was taken to the operating room. Vital signs were stable.  The patient was underwent uncomplicated induction of general anesthesia after which time she was placed in the low lithotomy position.  She was prepped and draped in the usual sterile manner after which time she was carefully placed in the low lithotomy position with a great care taken for proper positioning of the legs.  Examination under anesthesia revealed a retroflexed normal size uterus with no adnexal masses.  The patient was then sterilely prepped and draped in the usual manner.  A medium weighted speculum was placed.  Anterior and posterior lip of the cervix was grasped utilizing thyroid tenaculum.  Gentle traction then results in excellent descensus of the uterus.  With the uterus retracted anteriorly and posteriorly, a total of 10 cc of 0.5% Marcaine with epinephrine was injected just beneath the mucosal layer.  The  uterus was then directed anteriorly.  A large Mayo scissors is used to atraumatically sharply enter the posterior cul-de-sac.  Efflux of peritoneum confirms proper peritoneal entry. Sequentially each of the uterosacral ligaments are then clamped, sutured and secured.  The right uterosacral ligament first clamped with a curved Heaney clamp and secured with a Heaney suture of 0 Vicryl; left handled in a likewise manner.  These are tagged for later inspection.  Likewise in the midline the peritoneal and vaginal mucosal layer are sutured in the midline for later retraction which will allow obliteration of the cul-de-sac.  I then directed the uterus posteriorly and incised just distal to the vesicouterine fold, dissecting straight down to the fascial layer.  This is done atraumatically. Then with blunt dissection I was able to mobilize the vaginal mucosa off the underlying uterus.  This does successfully result in entry into the anterior cul-de-sac which then allows mobilization of the bladder out of our operative field.  This is done atraumatically with no evidence of any bladder injury. The remainder of the case then proceeded sequentially with the uterine vessels each being ligated, first clamping with curved Heaney clamp followed by suture ligature of 0 Vicryl in Heaney fashion performed on the right and then on the left.  This secures some of the major vasculature to the uterus itself.  I was then able to put an examining finger through the posterior cul-de-sac and carefully examined the fundal portion of the uterus.  There was noted to be a complete absence of any significant adhesive disease.  The utero-ovarian ligaments were then identified.  Each of these requires two separate clampings due to large nature of the pedicle.  Each of these is then singly ligated with 0 Vicryl in the Heaney fashion.  This results in complete removal of the specimen.  The later pedicles are tagged for later  inspection.  Examination of the pedicles reveals near complete hemostasis with the exception of a small amount of bleeding from the posterior vaginal cuff.  The posterior vaginal cuff is then closed with 0 Vicryl in a continuous running lock suture from uterosacral ligament to uterosacral ligament to substantially diminish our current bleeding noted.  The peritoneal layer is identified at 12 oclock. This is clamped with a long Allis clamp.  A continuous 0 Vicryl running suture is then used to close the peritoneum in a pursestring fashion, progressing in a clockwise fashion.  Great care is taken to close the peritoneum so that our pedicles would become extraperitoneal.  The pursestring sutures are cinched down.  Great care is taken to avoid any prolapse of any tubes or ovaries.  The peritoneum then closed.  Sponge, needle and instrument counts are again confirmed to be correct x2.  The vaginal mucosa is then closed utilizing continuous running 0 Vicryl in a running locked fashion progressing anterior to posteriorly in a linear suture line.  This results in excellent secure closure.  There is noted to be excellent support of both the vaginal cuff anteriorly and posteriorly.  There appears to be no active bleeding; thus the Foley catheter is placed to straight drainage with findings of clear yellow urine.  A vaginal pack is then placed in the vaginal opening, saturated with antibiotic gel.  The patient tolerated the procedure very well.  She was taken out of the dorsal lithotomy position and extubated without difficulty, taken to the recovery room in stable condition at which time operative findings were discussed with the patients awaiting family.   Dictated by:   Langley Gauss, M.D. Attending Physician:  Jeri Cos. DD:  07/06/01 TD:  07/07/01 Job: [id number]

## 2010-08-27 NOTE — H&P (Signed)
[facility]  Patient:    Colleen Duncan, Colleen Duncan Visit Number: [id number] MRN: [id number]          Service Type: OUT Location: RAD Attending Physician:  Jonathon Bellows Dictated by:   Elpidio Anis, M.D. Admit Date:  09/03/2001 Discharge Date: 09/03/2001                           History and Physical  PREOPERATIVE HISTORY AND PHYSICAL  HISTORY OF PRESENT ILLNESS:  A 40 year old female with a history of recurrent abdominal pain with pain in her upper abdomen, right flank and right upper quadrant.  She has had periods of nausea with vomiting.  The pain has become much worse with food.  She has had chronic abdominal problems with episodes of constipation and diarrhea.  Upper GI series was normal.  HIDA scan initially was normal, but it later became abnormal.  EGD was normal and colonoscopy was normal.  The patient is felt to have chronic cholecystitis and was referred for a cholecystectomy.  PAST HISTORY:  She has hypertension and kidney stones.  MEDICATIONS: 1. Protonix 40 mg q.d. 2. MiraLax twice weekly p.r.n. 3. Phenergan p.r.n.  SURGERY: 1. Bilateral knee arthroscopy. 2. Total abdominal hysterectomy. 3. Bilateral tubal ligation.  FAMILY HISTORY:  Positive for lymphoma, chronic obstructive pulmonary disease and hypertension.  PHYSICAL EXAMINATION:  VITAL SIGNS:  Blood pressure 100/68, pulse 62, respirations 18, weight 137 pounds.  HEENT:  Unremarkable.  NECK:  Supple without JVD or bruit.  CHEST:  Clear to auscultation.  HEART:  Regular rate and rhythm without murmur, gallop or rub; normal S1 and S2.  ABDOMEN:  Mild distention, tenderness to palpation in the right upper quadrant and right flank.  EXTREMITIES:  No cyanosis, clubbing or edema.  NEUROLOGIC EXAM:  No focal motor, sensory or cerebellar deficit.  IMPRESSION: 1. Chronic cholecystitis with biliary colic. 2. Hypertension. 3. History of kidney stones. 4. 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  PLAN:  Laparoscopic cholecystectomy. Dictated by:   Elpidio Anis, M.D. Attending Physician:  Jonathon Bellows DD:  11/02/01 TD:  11/02/01 Job: [id number]

## 2010-08-27 NOTE — Op Note (Signed)
NAME:  Colleen Duncan, Colleen Duncan                            ACCOUNT NO.:  [id number]   MEDICAL RECORD NO.:  [id number]                   PATIENT TYPE:  AMB   LOCATION:  DAY                                  FACILITY:  [facility]   PHYSICIAN:  R. Roetta Sessions, M.D.              DATE OF BIRTH:  [birth year]   DATE OF PROCEDURE:  01/20/2003  DATE OF DISCHARGE:                                 OPERATIVE REPORT   PROCEDURE:  Esophagogastroduodenoscopy diagnostic.   ENDOSCOPIST:  Gerrit Friends. Rourk, M.D.   INDICATIONS FOR PROCEDURE:  The patient is a 40 year old lady with  intermittent nausea and vomiting associated with right upper quadrant  abdominal pain in the setting of chronic aspirin powder use. She is status  post cholecystectomy. She is anemic.  EGD is now being done to further  evaluate her symptoms.  The approach has been discussed with the patient at  length.  The potential risks, benefits, and alternatives have been reviewed;  and questions answered.  Please see my January 13, 2003 H&P for more  information.   PROCEDURE NOTE:  O2 saturation, blood pressure, pulse and respirations were  monitored throughout the entire procedure.  Conscious sedation: Versed 6 mg  IV, Demerol 125 mg IV in divided doses, Cetacaine spray for topical  oropharyngeal anesthesia.   INSTRUMENT:  Olympus video chip adult gastroscope.   FINDINGS:  Examination of the tubular esophagus revealed no abnormalities.  The EG junction was easily traversed.   STOMACH:  The gastric cavity insufflated well with air.  A thorough  examination of the gastric mucosa including a retroflex view of the proximal  stomach and esophagogastric junction demonstrated multiple small,  superficial appearing antral erosions.  There was no ulcer or infiltrating  process. The pylorus was patent and easily traversed.   DUODENUM:  The bulb and the second portion appeared normal.   THERAPEUTIC/DIAGNOSTIC MANEUVERS:  None.   The patient tolerated the  procedure well and was reacted in endoscopy.   IMPRESSION:  1. Normal esophagus.  2. Antral erosions, otherwise normal stomach.  3. Normal D1 and D2.    RECOMMENDATIONS:  1. The patient should stop taking all nonsteroidals including headache     powders containing aspirin.  2. Continue Protonix 40 mg orally daily.  Will follow up on pending H&H.     Today we will have her come back to see [name] in the office in 1 month.      ___________________________________________                                            Jonathon Bellows, M.D.   RMR/MEDQ  D:  01/20/2003  T:  01/20/2003  Job:  [id number]   cc:   Kirk Ruths, M.D.  [id number]  [place]  [place] [address]  Fax: [phone]

## 2010-08-27 NOTE — Op Note (Signed)
[facility]  Patient:    Colleen Duncan, TRIVETT Visit Number: [id number] MRN: [id number]          Service Type: END Location: DAY Attending Physician:  Jonathon Bellows Dictated by:   Roetta Sessions, M.D. Proc. Date: 05/01/01 Admit Date:  05/01/2001   CC:         [facility]   Operative Report  PROCEDURE:  Esophagogastroduodenoscopy with [name] dilation followed by diagnostic colonoscopy.  GASTROENTEROLOGIST:  Roetta Sessions, M.D.  INDICATIONS:  The patient is an 40 year old lady with intermittent nausea, vomiting, epigastric pain, and reflux symptoms.  She was taking NSAIDs including Goody powders.  She was started on Protonix 40 mg orally recently. Also hematochezia in the setting of intermittent constipation and diarrhea. She also has esophageal dysphagia.  EGD and colonoscopy are now being done to further evaluate her symptoms.  This approach has been discussed with Ms. Tashiro previously and again today at the bedside.  Potential risks, benefits, and alternatives have been reviewed.  Ms. Fors is also having right flank pain.  She has seen a urologist down in [place], and she was found to have hematuria on recent UA.  In addition, she is also noted to be hemoccult positive.  EGD and colonoscopy are now being done to evaluate the above-mentioned concerned.  This approach has been discussed with Ms. Roussin as noted above.  The patient is at low risk for conscious sedation. PROCEDURE NOTE:  O2 saturation, blood pressure, and pulse for this patient were monitored throughout the entirety of both procedures.  CONSCIOUS SEDATION:  Versed 3 mg IV in divided doses, Demerol 75 mg IV in divided doses.  INSTRUMENT:  Olympus video chip gastroscope and colonoscope.  EGD FINDINGS:   Examination of the tubular esophagus revealed normal mucosa. EG junction was easily traversed.  STOMACH:  Gastric cavity was emptied and insufflated well with air.   Thorough examination of gastric mucosa including retroflexed view of the proximal stomach and esophagogastric junction no abnormalities.  Pylorus was patent and easily traversed.  DUODENUM: Bulb and second portion appeared normal.  THERAPEUTIC/DIAGNOSTIC MANEUVERS PERFORMED:  A 40-French Maloney dilator was passed to full insertion without resistance and without blood trauma from the dilator.  Look back revealed no apparent complication related to passage of the dilator.  The patient tolerated the procedure well and was prepared for colonoscopy.  Digital examination revealed no abnormalities.  ENDOSCOPIC FINDINGS:  Prep was good.  Rectum:  Examination of rectal mucosa including retroflexed view of the anal verge revealed a couple of internal hemorrhoids.  COLON:  Colonic mucosa was surveyed from the rectosigmoid junction through the left transverse right colon to the area of the appendiceal orifice and ileocecal valve and cecum.  These structures was well seen and photographed for the record.  The colonic mucosa at the level of the cecum appeared normal. From the level of the cecum and ileocecal valve, the scope was slowly withdrawn.  All previously mentioned mucosal surfaces were again seen.  Again, no other abnormalities were seen.  The patient tolerated both procedures well and was reacted in endoscopy.  IMPRESSION: 1. Normal upper gastrointestinal tract status post passage of 54-French    Maloney dilator. 2. Internal hemorrhoids, otherwise normal rectum. 3. Normal colon.  I suspect the patients hematochezia is secondary to hemorrhoids.  She responded to [facility] dilation previously and hopefully her dysphagia will improve after having had her esophagus dilated.  RECOMMENDATIONS: 1. Continue Protonix 40 mg orally daily. 2. Hemorrhoid  literature provided to Ms. Pridgen. 3. Daily Metamucil, Citrucel, or FiberCon. 4. A 10-day course of Anusol HC suppositories 1 per rectum at  bedtime. 5. She should go back to the urologist for further evaluation of her    flank pain and hematuria.  She has a history of nephrolithiasis on the    right side. 6. Followup appointment in three to four weeks to assess her gastrointestinal    symptoms. Dictated by:   Roetta Sessions, M.D. Attending Physician:  Jonathon Bellows DD:  05/01/01 TD:  05/01/01 Job: [id number]

## 2010-09-19 ENCOUNTER — Emergency Department (HOSPITAL_COMMUNITY): Payer: 59

## 2010-09-19 ENCOUNTER — Emergency Department (HOSPITAL_COMMUNITY)
Admission: EM | Admit: 2010-09-19 | Discharge: 2010-09-19 | Disposition: A | Discharge status: Home / Self Care | Payer: 59 | Attending: Emergency Medicine | Admitting: Emergency Medicine

## 2010-09-19 DIAGNOSIS — M542 Cervicalgia: Secondary | ICD-10-CM | POA: Insufficient documentation

## 2010-09-19 DIAGNOSIS — M25579 Pain in unspecified ankle and joints of unspecified foot: Secondary | ICD-10-CM | POA: Insufficient documentation

## 2010-09-19 DIAGNOSIS — M545 Low back pain, unspecified: Secondary | ICD-10-CM | POA: Insufficient documentation

## 2010-09-19 DIAGNOSIS — Y9239 Other specified sports and athletic area as the place of occurrence of the external cause: Secondary | ICD-10-CM | POA: Insufficient documentation

## 2010-09-19 DIAGNOSIS — Y92838 Other recreation area as the place of occurrence of the external cause: Secondary | ICD-10-CM | POA: Insufficient documentation

## 2010-11-30 ENCOUNTER — Other Ambulatory Visit: Payer: Self-pay | Admitting: Obstetrics and Gynecology

## 2010-12-28 NOTE — Patient Instructions (Addendum)
   Your procedure is scheduled ZO:XWRUEAV  Enter through the Main Entrance of [facility] at: 6am Pick up the phone at the desk and dial [phone]  Please call this number if you have any problems the morning of surgery: [phone]  Remember: Do not eat food after midnight  Do not drink clear liquids after:midnight Take these medicines the morning of surgery with a SIP OF WATER:nexium and inderal  Do not wear jewelry, make-up, or FINGER nail polish Do not wear lotions, powders, or perfumes.no deodorants Do not shave 48 hours prior to surgery. Do not bring valuables to the hospital.  Patients discharged on the day of surgery will not be allowed to drive home.   Name and phone number of your driver:Todd Philpot-[phone]  Remember to use your hibiclens as instructed.

## 2010-12-29 ENCOUNTER — Other Ambulatory Visit: Payer: Self-pay

## 2010-12-29 ENCOUNTER — Other Ambulatory Visit (HOSPITAL_COMMUNITY): Payer: 59

## 2010-12-29 ENCOUNTER — Encounter (HOSPITAL_COMMUNITY)
Admission: RE | Admit: 2010-12-29 | Discharge: 2010-12-29 | Disposition: A | Discharge status: Home / Self Care | Payer: 59 | Source: Ambulatory Visit | Attending: Obstetrics and Gynecology | Admitting: Obstetrics and Gynecology

## 2010-12-29 ENCOUNTER — Encounter (HOSPITAL_COMMUNITY): Payer: Self-pay

## 2010-12-29 HISTORY — DX: Gastro-esophageal reflux disease without esophagitis: K21.9

## 2010-12-29 HISTORY — DX: Anxiety disorder, unspecified: F41.9

## 2010-12-29 HISTORY — DX: Essential (primary) hypertension: I10

## 2010-12-29 HISTORY — DX: Nausea with vomiting, unspecified: Z98.890

## 2010-12-29 HISTORY — DX: Major depressive disorder, single episode, unspecified: F32.9

## 2010-12-29 HISTORY — DX: Depression, unspecified: F32.A

## 2010-12-29 HISTORY — DX: Irritable bowel syndrome, unspecified: K58.9

## 2010-12-29 HISTORY — DX: Nausea with vomiting, unspecified: R11.2

## 2010-12-29 HISTORY — DX: Interstitial cystitis (chronic) without hematuria: N30.10

## 2010-12-29 HISTORY — DX: Other seasonal allergic rhinitis: J30.2

## 2010-12-29 LAB — CBC
HCT: 32.4 % — ABNORMAL LOW (ref 36.0–46.0)
RDW: 16.1 % — ABNORMAL HIGH (ref 11.5–15.5)
WBC: 6.1 10*3/uL (ref 4.0–10.5)

## 2010-12-29 LAB — SURGICAL PCR SCREEN
MRSA, PCR: NEGATIVE
Staphylococcus aureus: POSITIVE — AB

## 2011-01-04 ENCOUNTER — Ambulatory Visit (HOSPITAL_COMMUNITY)
Admission: RE | Admit: 2011-01-04 | Discharge: 2011-01-04 | Discharge status: Against Medical Advice | Payer: 59 | Source: Ambulatory Visit | Attending: Obstetrics and Gynecology | Admitting: Obstetrics and Gynecology

## 2011-01-04 ENCOUNTER — Ambulatory Visit (HOSPITAL_COMMUNITY): Payer: 59 | Admitting: Anesthesiology

## 2011-01-04 ENCOUNTER — Encounter (HOSPITAL_COMMUNITY): Payer: Self-pay | Admitting: Anesthesiology

## 2011-01-04 ENCOUNTER — Encounter (HOSPITAL_COMMUNITY)
Admission: RE | Discharge status: Absent without leave | Payer: Self-pay | Source: Ambulatory Visit | Attending: Obstetrics and Gynecology

## 2011-01-04 ENCOUNTER — Other Ambulatory Visit: Payer: Self-pay

## 2011-01-04 DIAGNOSIS — A630 Anogenital (venereal) warts: Secondary | ICD-10-CM | POA: Insufficient documentation

## 2011-01-04 DIAGNOSIS — Z01812 Encounter for preprocedural laboratory examination: Secondary | ICD-10-CM | POA: Insufficient documentation

## 2011-01-04 DIAGNOSIS — IMO0002 Reserved for concepts with insufficient information to code with codable children: Secondary | ICD-10-CM | POA: Insufficient documentation

## 2011-01-04 DIAGNOSIS — Z01818 Encounter for other preprocedural examination: Secondary | ICD-10-CM | POA: Insufficient documentation

## 2011-01-04 DIAGNOSIS — N949 Unspecified condition associated with female genital organs and menstrual cycle: Secondary | ICD-10-CM | POA: Insufficient documentation

## 2011-01-04 HISTORY — PX: LAPAROSCOPY: SHX197

## 2011-01-04 SURGERY — LAPAROSCOPY, DIAGNOSTIC
Anesthesia: General

## 2011-01-04 MED ORDER — PROPOFOL 10 MG/ML IV EMUL
INTRAVENOUS | Status: AC
  Filled 2011-01-04: qty 20

## 2011-01-04 MED ORDER — HYDROMORPHONE HCL 1 MG/ML IJ SOLN
INTRAMUSCULAR | Status: AC
  Filled 2011-01-04: qty 1

## 2011-01-04 MED ORDER — ONDANSETRON HCL 4 MG/2ML IJ SOLN
INTRAMUSCULAR | Status: DC | PRN
  Administered 2011-01-04: 4 mg via INTRAVENOUS

## 2011-01-04 MED ORDER — FENTANYL CITRATE 0.05 MG/ML IJ SOLN
25.0000 ug | INTRAMUSCULAR | Status: DC | PRN
  Administered 2011-01-04: 50 ug via INTRAVENOUS

## 2011-01-04 MED ORDER — FENTANYL CITRATE 0.05 MG/ML IJ SOLN
INTRAMUSCULAR | Status: AC
  Filled 2011-01-04: qty 5

## 2011-01-04 MED ORDER — BUPIVACAINE HCL (PF) 0.25 % IJ SOLN: INTRAMUSCULAR | Status: DC | PRN

## 2011-01-04 MED ORDER — KETOROLAC TROMETHAMINE 30 MG/ML IJ SOLN
INTRAMUSCULAR | Status: DC | PRN
  Administered 2011-01-04: 30 mg via INTRAVENOUS

## 2011-01-04 MED ORDER — EPHEDRINE 5 MG/ML INJ
INTRAVENOUS | Status: AC
  Filled 2011-01-04: qty 10

## 2011-01-04 MED ORDER — SCOPOLAMINE 1 MG/3DAYS TD PT72
MEDICATED_PATCH | TRANSDERMAL | Status: AC
  Filled 2011-01-04: qty 1

## 2011-01-04 MED ORDER — GLYCOPYRROLATE 0.2 MG/ML IJ SOLN
INTRAMUSCULAR | Status: DC | PRN
  Administered 2011-01-04: 0.1 mg via INTRAVENOUS

## 2011-01-04 MED ORDER — FENTANYL CITRATE 0.05 MG/ML IJ SOLN
INTRAMUSCULAR | Status: DC | PRN
  Administered 2011-01-04 (×2): 50 ug via INTRAVENOUS
  Administered 2011-01-04: 150 ug via INTRAVENOUS

## 2011-01-04 MED ORDER — SUCCINYLCHOLINE CHLORIDE 20 MG/ML IJ SOLN
INTRAMUSCULAR | Status: DC | PRN
  Administered 2011-01-04: 100 mg via INTRAVENOUS

## 2011-01-04 MED ORDER — OXYCODONE-ACETAMINOPHEN 10-325 MG PO TABS: 1.0000 | ORAL_TABLET | ORAL | Status: AC | PRN

## 2011-01-04 MED ORDER — PROPOFOL 10 MG/ML IV EMUL
INTRAVENOUS | Status: DC | PRN
  Administered 2011-01-04: 150 mg via INTRAVENOUS

## 2011-01-04 MED ORDER — MIDAZOLAM HCL 2 MG/2ML IJ SOLN
INTRAMUSCULAR | Status: AC
  Filled 2011-01-04: qty 2

## 2011-01-04 MED ORDER — ROCURONIUM BROMIDE 100 MG/10ML IV SOLN
INTRAVENOUS | Status: DC | PRN
  Administered 2011-01-04: 10 mg via INTRAVENOUS

## 2011-01-04 MED ORDER — LACTATED RINGERS IV SOLN
INTRAVENOUS | Status: DC
Start: 2011-01-04 — End: 2011-01-04
  Administered 2011-01-04 (×2): via INTRAVENOUS

## 2011-01-04 MED ORDER — DEXAMETHASONE SODIUM PHOSPHATE 10 MG/ML IJ SOLN
INTRAMUSCULAR | Status: AC
  Filled 2011-01-04: qty 1

## 2011-01-04 MED ORDER — ROCURONIUM BROMIDE 50 MG/5ML IV SOLN
INTRAVENOUS | Status: AC
  Filled 2011-01-04: qty 1

## 2011-01-04 MED ORDER — HYDROMORPHONE HCL 1 MG/ML IJ SOLN
INTRAMUSCULAR | Status: DC | PRN
  Administered 2011-01-04 (×2): 1 mg via INTRAVENOUS

## 2011-01-04 MED ORDER — MIDAZOLAM HCL 5 MG/5ML IJ SOLN
INTRAMUSCULAR | Status: DC | PRN
  Administered 2011-01-04: 2 mg via INTRAVENOUS

## 2011-01-04 MED ORDER — LIDOCAINE HCL (CARDIAC) 20 MG/ML IV SOLN
INTRAVENOUS | Status: DC | PRN
  Administered 2011-01-04: 60 mg via INTRAVENOUS

## 2011-01-04 MED ORDER — SUCCINYLCHOLINE CHLORIDE 20 MG/ML IJ SOLN
INTRAMUSCULAR | Status: AC
  Filled 2011-01-04: qty 1

## 2011-01-04 MED ORDER — SCOPOLAMINE 1 MG/3DAYS TD PT72
1.0000 | MEDICATED_PATCH | TRANSDERMAL | Status: DC
  Administered 2011-01-04: 1.5 mg via TRANSDERMAL

## 2011-01-04 MED ORDER — GLYCOPYRROLATE 0.2 MG/ML IJ SOLN
INTRAMUSCULAR | Status: AC
  Filled 2011-01-04: qty 1

## 2011-01-04 MED ORDER — CEFAZOLIN SODIUM 1-5 GM-% IV SOLN: 1.0000 g | Freq: Once | INTRAVENOUS | Status: DC

## 2011-01-04 MED ORDER — OXYCODONE HCL 5 MG PO TABS: 10.0000 mg | ORAL_TABLET | ORAL | Status: AC | PRN

## 2011-01-04 MED ORDER — DEXAMETHASONE SODIUM PHOSPHATE 10 MG/ML IJ SOLN
INTRAMUSCULAR | Status: DC | PRN
  Administered 2011-01-04: 10 mg via INTRAVENOUS

## 2011-01-04 MED ORDER — CEFAZOLIN SODIUM 1-5 GM-% IV SOLN
INTRAVENOUS | Status: AC
  Administered 2011-01-04: 1 g via INTRAVENOUS
  Filled 2011-01-04: qty 50

## 2011-01-04 MED ORDER — FENTANYL CITRATE 0.05 MG/ML IJ SOLN
INTRAMUSCULAR | Status: AC
  Administered 2011-01-04: 50 ug via INTRAVENOUS
  Filled 2011-01-04: qty 2

## 2011-01-04 MED ORDER — LIDOCAINE HCL (CARDIAC) 20 MG/ML IV SOLN
INTRAVENOUS | Status: AC
  Filled 2011-01-04: qty 5

## 2011-01-04 MED ORDER — LACTATED RINGERS IR SOLN
Status: DC | PRN
  Administered 2011-01-04: 3000 mL

## 2011-01-04 MED ORDER — ONDANSETRON HCL 4 MG/2ML IJ SOLN
INTRAMUSCULAR | Status: AC
  Filled 2011-01-04: qty 2

## 2011-01-04 SURGICAL SUPPLY — 30 items
ADH SKN CLS APL DERMABOND .7 (GAUZE/BANDAGES/DRESSINGS) ×2
APL SKNCLS STERI-STRIP NONHPOA (GAUZE/BANDAGES/DRESSINGS) ×2
BARRIER ADHS 3X4 INTERCEED (GAUZE/BANDAGES/DRESSINGS) ×2 IMPLANT
BENZOIN TINCTURE PRP APPL 2/3 (GAUZE/BANDAGES/DRESSINGS) ×3 IMPLANT
BRR ADH 4X3 ABS CNTRL BYND (GAUZE/BANDAGES/DRESSINGS) ×2
CABLE HIGH FREQUENCY MONO STRZ (ELECTRODE) ×2 IMPLANT
CANISTER SUCTION 2500CC (MISCELLANEOUS) ×3 IMPLANT
CATH ROBINSON RED A/P 16FR (CATHETERS) IMPLANT
CLOTH BEACON ORANGE TIMEOUT ST (SAFETY) ×3 IMPLANT
DERMABOND ADVANCED (GAUZE/BANDAGES/DRESSINGS) ×1
DERMABOND ADVANCED .7 DNX12 (GAUZE/BANDAGES/DRESSINGS) ×1 IMPLANT
FORCEPS CUTTING 33CM 5MM (CUTTING FORCEPS) ×2 IMPLANT
GLOVE BIO SURGEON STRL SZ 6.5 (GLOVE) ×6 IMPLANT
GLOVE BIO SURGEON STRL SZ8 (GLOVE) IMPLANT
GOWN PREVENTION PLUS LG XLONG (DISPOSABLE) ×6 IMPLANT
NS IRRIG 1000ML POUR BTL (IV SOLUTION) ×3 IMPLANT
PACK LAPAROSCOPY BASIN (CUSTOM PROCEDURE TRAY) ×3 IMPLANT
SCISSORS LAP 5X35 DISP (ENDOMECHANICALS) ×2 IMPLANT
SEALER TISSUE G2 CVD JAW 35 (ENDOMECHANICALS) IMPLANT
SEALER TISSUE G2 CVD JAW 45CM (ENDOMECHANICALS)
SET IRRIG TUBING LAPAROSCOPIC (IRRIGATION / IRRIGATOR) ×2 IMPLANT
SLEEVE Z-THREAD 5X100MM (TROCAR) ×1 IMPLANT
SUT PLAIN 3 0 FS 2 27 (SUTURE) ×3 IMPLANT
SUT VICRYL 0 UR6 27IN ABS (SUTURE) IMPLANT
TOWEL OR 17X24 6PK STRL BLUE (TOWEL DISPOSABLE) ×6 IMPLANT
TRAY FOLEY CATH 14FR (SET/KITS/TRAYS/PACK) ×3 IMPLANT
TROCAR Z-THREAD BLADED 11X100M (TROCAR) IMPLANT
TROCAR Z-THREAD FIOS 5X100MM (TROCAR) ×1 IMPLANT
WARMER LAPAROSCOPE (MISCELLANEOUS) ×3 IMPLANT
WATER STERILE IRR 1000ML POUR (IV SOLUTION) ×3 IMPLANT

## 2011-01-04 NOTE — Brief Op Note (Signed)
01/04/2011  9:07 AM  PATIENT:  Colleen Duncan  40 y.o. female  PRE-OPERATIVE DIAGNOSIS:  Pelvic pain, dyspareunia, perineal condyloma  POST-OPERATIVE DIAGNOSIS:  Pelvic pain, dyspareunia, perineal condyloma, left hydatid cysts, left fallopian tube mass, pelvic and abdominal adhesions  PROCEDURE:  Procedure(s): LAPAROSCOPIC BILATERAL SALPINGECTOMIES, EXCISION OF FIBROSIS AND ADHESIONS, EXCISION OF PERINEAL CONDYLOMA  SURGEON:  Surgeon(s): Litsy Epting A Silva  PHYSICIAN ASSISTANT:   ASSISTANTS: Lodema Hong  ANESTHESIA:   general  OR FLUID I/O:  Total I/O In: 2000 [I.V.:2000] Out: 55 [Urine:50; Blood:5]  BLOOD ADMINISTERED:none  DRAINS: none   LOCAL MEDICATIONS USED:  NONE  SPECIMEN:  Source of Specimen:  bilateral fallopian tubes, fibrosis, condyloma  DISPOSITION OF SPECIMEN:  PATHOLOGY  COUNTS:  YES  TOURNIQUET:  * No tourniquets in log *  DICTATION: .Other Dictation: Dictation Number   PLAN OF CARE: Discharge to home after PACU  PATIENT DISPOSITION:  PACU - hemodynamically stable.   Delay start of Pharmacological VTE agent (>24hrs) due to surgical blood loss or risk of bleeding:  not applicable

## 2011-01-04 NOTE — H&P (Signed)
NAMEREVELLA, SHELTON NO.:  [id number]  MEDICAL RECORD NO.:  [id number]  LOCATION:                                 FACILITY:  PHYSICIAN:  Randye Lobo, M.D.   DATE OF BIRTH:  Nov 21, 1970  DATE OF ADMISSION: DATE OF DISCHARGE:                             HISTORY & PHYSICAL   CHIEF COMPLAINT:  Pelvic pain and dyspareunia.  HISTORY OF PRESENT ILLNESS:  The patient is a 40 year old gravida 3, para 2-0-1-2 Caucasian female, status post hysterectomy who presents with pelvic pain and painful intercourse.  The patient reports that she has a diagnosis of endometriosis given to her in 82.  She reports cyclic pelvic pain, the right lower quadrant greater than the left lower quadrant, which occurs approximately every 3 weeks and lasts for 1 day or more at a time.  The pain prevents the patient from being able to work.  She also reports positional dyspareunia.  Pelvic ultrasound on November 26, 2010, documents a complex left ovarian cyst measuring 3.3 cm, which is most consistent with a hemorrhagic cyst. The right ovary was normal.  No free fluid was noted.  The left ovarian cyst had no abnormal blood flow pattern on Doppler.  The patient does have a history of chronic interstitial cystitis.  She is under the care of Dr. Osie Cheeks and his team at [facility].  The patient is status post cystoscopy.  She has had bladder installations and her pain has been treated with Elmiron, amitriptyline, and Uribel. When questioned, the patient reports that her bladder feels fine.  The patient was treated for urinary tract infection in July 2012.  The patient is requesting surgical evaluation and treatment of her pain.  PAST OBSTETRIC AND GYNECOLOGIC HISTORY: 1. Status post vaginal delivery x2. 2. Status post laparoscopic treatment of ectopic pregnancy. 3. Mammogram Aug 31, 2010, within normal limits. 4. 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  PAST MEDICAL HISTORY: 1.  Interstitial cystitis. 2. Gastroesophageal reflux disease. 3. History of nephrolithiasis. 4. Anxiety.  PAST SURGICAL HISTORY: 1. Status post hysterectomy. 2. Status post cholecystectomy. 3. Status post neck surgery.  The patient reports that she has chronic     pain and that she has been taking Percocet and Tylox since 2007 or     2008.  She sees [facility]. 4. Status post bilateral knee surgery. 5. Status post tubal pregnancy surgery. 6. Status post bilateral tubal ligation. 7. Status post cystoscopy with bladder installations.  MEDICATIONS:  Nexium, Xanax, propranolol, Percocet, and Zyrtec.  ALLERGIES:  MORPHINE.  SOCIAL HISTORY:  The patient is engaged.  She works for [employer].  She denies the use of tobacco, alcohol, or illicit drugs.  FAMILY HISTORY:  Positive for breast cancer in the patient's mother and positive for pancreatic cancer in the patient's grandmother.  PHYSICAL EXAMINATION:  VITAL SIGNS:  Height 5 feet and 4-1/2 inches, weight 153 pounds, blood pressure 102/68. LUNGS:  Clear to auscultation bilaterally. HEART:  S1 and S2 with regular rate and rhythm. ABDOMEN:  Soft and nontender without evidence of hepatosplenomegaly or organomegaly. PELVIC:  There are three condyloma noted of  the right labia majora along the inferior aspect.  The urethra and vagina demonstrates no masses. The cervix is absent.  On bimanual examination, there is fullness palpable in the right adnexal region, which is tender.  There is no evidence of any left adnexal masses nor tenderness.  IMPRESSION:  The patient is a 40 year old gravida 3, para 2-0-1-2 Caucasian female, status post hysterectomy who presents with pelvic pain and dyspareunia.  The patient does have a history of chronic interstitial cystitis.  The patient has new-onset condyloma.  PLAN:  The patient will undergo a laparoscopy with possible treatment of endometriosis, possible  bilateral salpingo-oophorectomy if she has findings of significant endometriosis.  She will also have excision of the vulvar condyloma.  Risks, benefits, and alternatives have been reviewed with the patient who wishes to proceed.     Randye Lobo, M.D.     BES/MEDQ  D:  01/03/2011  T:  01/03/2011  Job:  [id number]

## 2011-01-04 NOTE — Anesthesia Postprocedure Evaluation (Signed)
Anesthesia Post Note  Patient: Colleen Duncan  Procedure(s) Performed:  LAPAROSCOPY DIAGNOSTIC - Bilateral salpingoectomy excision of fibrosis adhesive/lysis Removal of perineal condyloma  Anesthesia type: General  Patient location: PACU  Post pain: Pain level controlled  Post assessment: Post-op Vital signs reviewed  Last Vitals:  Filed Vitals:   01/04/11 0858  BP: 124/75  Pulse: 100  Temp: 98.5 F (36.9 C)  Resp: 10    Post vital signs: Reviewed  Level of consciousness: sedated  Complications: No apparent anesthesia complicationsfj

## 2011-01-04 NOTE — Anesthesia Preprocedure Evaluation (Signed)
Anesthesia Evaluation  Name, MR# and DOB Patient awake  General Assessment Comment  Reviewed: Allergy & Precautions, H&P , NPO status , Patient's Chart, lab work & pertinent test results, reviewed documented beta blocker date and time   History of Anesthesia Complications (+) PONV  Airway Mallampati: I TM Distance: >3 FB Neck ROM: limited    Dental  (+) Teeth Intact   Pulmonary  clear to auscultation  breath sounds clear to auscultation none    Cardiovascular hypertension, On Home Beta Blockers regular Normal    Neuro/Psych   Headaches  (+) PSYCHIATRIC DISORDERS (depression, anxiety),  Chronic neck pain and hand/arm paresthesias and numbness (on percocet)   GI/Hepatic/Renal   negative Liver ROS  negative Renal ROS   GERD Medicated and Controlled     Endo/Other  Negative Endocrine ROS (+)      Abdominal   Musculoskeletal   Hematology negative hematology ROS (+)   Peds  Reproductive/Obstetrics    Anesthesia Other Findings Interstitial cystitis irritable bowel            Anesthesia Physical Anesthesia Plan  ASA: II  Anesthesia Plan: General   Post-op Pain Management:    Induction:   Airway Management Planned:   Additional Equipment:   Intra-op Plan:   Post-operative Plan:   Informed Consent: I have reviewed the patients History and Physical, chart, labs and discussed the procedure including the risks, benefits and alternatives for the proposed anesthesia with the patient or authorized representative who has indicated his/her understanding and acceptance.     Plan Discussed with: CRNA and Surgeon  Anesthesia Plan Comments:         Anesthesia Quick Evaluation

## 2011-01-04 NOTE — OR Nursing (Signed)
MD at OR bedside. States she had a conversation with patient about removing vaginal condylomas to add to procedure

## 2011-01-04 NOTE — Progress Notes (Signed)
Pre-op Note  No marked change in status since office pre-op visit.

## 2011-01-04 NOTE — Transfer of Care (Signed)
Immediate Anesthesia Transfer of Care Note  Patient: Colleen Duncan  Procedure(s) Performed:  LAPAROSCOPY DIAGNOSTIC - Bilateral salpingoectomy excision of fibrosis adhesive/lysis Removal of perineal condyloma  Patient Location: PACU  Anesthesia Type: General  Level of Consciousness: awake, alert  and oriented  Airway & Oxygen Therapy: Patient Spontanous Breathing and Patient connected to nasal cannula oxygen  Post-op Assessment: Report given to PACU RN and Post -op Vital signs reviewed and stable  Post vital signs: Reviewed and stable  Complications: No apparent anesthesia complications

## 2011-01-04 NOTE — Anesthesia Procedure Notes (Signed)
Procedure Name: Intubation Date/Time: 01/04/2011 7:27 AM Performed by: Karleen Dolphin Pre-anesthesia Checklist: Patient identified, Timeout performed, Emergency Drugs available, Patient being monitored and Suction available Patient Re-evaluated:Patient Re-evaluated prior to inductionOxygen Delivery Method: Circle System Utilized Preoxygenation: Pre-oxygenation with 100% oxygen Intubation Type: IV induction Ventilation: Mask ventilation without difficulty Laryngoscope Size: 3 and Mac Grade View: Grade I Tube type: Oral Tube size: 7.0 mm Number of attempts: 1 Airway Equipment and Method: stylet Placement Confirmation: ETT inserted through vocal cords under direct vision,  breath sounds checked- equal and bilateral,  positive ETCO2 and CO2 detector Secured at: 20 cm Tube secured with: Tape Dental Injury: Teeth and Oropharynx as per pre-operative assessment

## 2011-01-04 NOTE — Op Note (Signed)
NAMEKRYSTYN, PICKING NO.:  [id number]  MEDICAL RECORD NO.:  [id number]  LOCATION:  [facility]                          FACILITY:  [facility]  PHYSICIAN:  Randye Lobo, M.D.   DATE OF BIRTH:  Aug 13, 1970  DATE OF PROCEDURE:  01/04/2011 DATE OF DISCHARGE:                              OPERATIVE REPORT   PREOPERATIVE DIAGNOSES: 1. Pelvic pain. 2. Dyspareunia. 3. Perineal condyloma.  POSTOPERATIVE DIAGNOSES: 1. Pelvic pain. 2. Dysmenorrhea. 3. Pelvic and intraabdominal adhesions. 4. Hydatid cyst of left fallopian tube. 5. Possible fibroma of right fallopian tube.  PROCEDURES:  Laparoscopic bilateral salpingectomies, excision of pelvic adhesions, adhesiolysis, and excision of perineal condyloma.  SURGEON:  Randye Lobo, MD  ASSISTANT:  Luvenia Redden, MD  ANESTHESIA:  General endotracheal.  IV FLUIDS:  2000 mL of Ringer's lactate.  ESTIMATED BLOOD LOSS:  Minimal.  URINE OUTPUT:  50 mL  COMPLICATIONS:  None.  INDICATIONS FOR PROCEDURE:  The patient is a 40 year old, gravida 3, para 2-0-1-2 Caucasian female, status post hysterectomy who presents with pelvic pain and painful intercourse.  The patient reports that she has a diagnosis of endometriosis given to her in 74.  The patient's pain is bilateral right and left lower quadrants and is cyclic in nature.  She has more pain on the right side than the left.  She also reports positional dyspareunia.  The patient had an ultrasound on November 26, 2010, documenting a complex left ovarian cyst measuring 3.3 cm. This was consistent with a hemorrhagic cyst.  The right ovary was normal.  The patient does have a history of interstitial cystitis which has been treated by a urologist with cystoscopies and bladder installations along with oral bladder analgesics.  The patient's pelvic pain is incapacitating at times and she is requesting surgical evaluation and treatment.  A plan is therefore made to proceed with a  laparoscopy with possible treatment of endometriosis and possible bilateral salpingo-oophorectomy.  The patient will also have excision of perineal condyloma.  Risks, benefits, and alternatives have been reviewed with the patient who wishes to proceed.  FINDINGS:  Examination of the perineum revealed multiple 2-3 mm condyloma scattered across the perineal body.  Laparoscopy demonstrated a 1-cm thick band of fibrosis which stretched across the bladder from the left vaginal apex to the right vaginal apex. There was a clear space of peritoneum between a thickened band and the visceral peritoneum overlying the bladder.  The right ovary was more pulled to the midline than the left ovary due to this adhesive band, but both ovaries were pulled toward the midline.  The left fallopian tube contained a 1-cm distal hydatid cyst.  There was also what appeared to be a possible left paratubal cyst which measured approximately 2 cm in diameter.  The left ovary itself was normal.  The right ovary itself was normal.  There was a 0.75-cm mass of the right fallopian tube which appeared to be consistent with a possible fibroma.  There was no evidence of endometriosis in the abdomen or pelvis.  The appendix was normal.  The area of the cecum contained filmy adhesions which drew the cecum up toward the right  lateral sidewall.  On the liver, there was an area of the left lobe which appeared to be a possible hemangioma which measured 2 cm in diameter.  SPECIMENS:  The bilateral fallopian tubes were sent to Pathology separately.  The fibrosis excised over the bladder was sent to Pathology as well.  The multiple condyloma were all sent together to Pathology.  PROCEDURE:  The patient was reidentified in the preoperative hold area. She received Ancef 1 g IV for antibiotic prophylaxis.  She received both TED hose and PAS stockings for DVT prophylaxis.  In the operating room, general endotracheal anesthesia was  induced and the patient was then placed in the dorsal lithotomy position.  The abdomen, vagina, and perineum were sterilely prepped and a Foley catheter was placed inside the bladder.  The patient was then sterilely draped.   A sponge stick was placed in the vagina.  Attention was turned to the abdomen where a 1-cm umbilical incision was created sharply with a scalpel.  The dissection down to the fascia was performed with an Allis clamp.  A 10-mm trocar was inserted directly into the peritoneal cavity without difficulty.  The laparoscope confirmed proper placement.  A CO2 pneumoperitoneum was achieved and the patient was then placed in the Trendelenburg position.  The 5-mm right and left lower quadrant incisions were created and 5-mm trocars were placed under visualization of the laparoscope.  An inspection of the pelvic and abdominal organs was performed and the findings were as noted above.  The procedure began by performing the left salpingectomy.  Monopolar scissors were used to dissect the left fallopian tube from the left ovary.  The gyrus instrument was then used to cauterize and cut the proximal fallopian tube from the broad ligament on the patient's left- hand side.  Hemostasis was good at this time and the specimen was placed in the posterior cul-de-sac.  Attention was then turned to the right fallopian tube.  Again, the base of the fallopian tube was excised from the surrounding broad ligament using the gyrus instrument.  The specimen was excised and placed in the cul-de-sac.  Again, hemostasis was good on the patient's right-hand side.  Please note the ureters were identified prior to excision of the fallopian tubes.  Attention was then turned to the thick band of fibrosis across the bladder and vaginal apex.  A window was created through the clear space between the area of fibrosis and the bladder using a laparoscopic scissors.  The gyrus instrument was then used  to cauterize through this second vascular band on both the right and left sides and the gyrus was then used to sharply excise the band.  This specimen was removed from the perineal cavity at this time and sent to Pathology.  Attention was turned at this time to the adhesions between the cecum and the right anterior abdominal wall.  These adhesions were lysed using a laparoscopic scissors.  There was a small amount of oozing from blood vessels in these adhesions and this was treated with bipolar cautery with the gyrus instrument.  The operating scope was placed through the umbilical trocar and each of the ovaries were removed separately and sent to Pathology separately.  The pelvis was then irrigated and suctioned and all of the operative sites were found to be hemostatic.  A piece of Interceed was placed around each of the fallopian tubes.  This concluded the patient's procedure laparoscopically.  The lower trocars were removed under visualization of the laparoscope.  The  CO2 pneumoperitoneum was released and the umbilical trocar and laparoscope were removed simultaneously.  The skin incisions were closed.  The umbilical incision was closed with a single suture of 0 Vicryl.  The skin incisions were closed with subcuticular sutures of 3-0 plain followed by Dermabond.  The perineal condyloma were removed with a tenotomy scissors and sent altogether to Pathology.  The surgical sites here were treated with silver nitrate for good hemostasis.  The sponge stick was removed from the vagina and the Foley catheter was taken out.  The patient was awakened, extubated, and escorted to the recovery room in stable condition.  There were no complications to the procedure.  All needle counts, instrument counts, and sponge counts were correct.     Randye Lobo, M.D.     BES/MEDQ  D:  01/04/2011  T:  01/04/2011  Job:  [id number]

## 2011-01-10 ENCOUNTER — Encounter (HOSPITAL_COMMUNITY): Payer: Self-pay | Admitting: Obstetrics and Gynecology

## 2011-01-26 LAB — BASIC METABOLIC PANEL
CO2: 27
Calcium: 9
Chloride: 107
Creatinine, Ser: 0.77
Glucose, Bld: 106 — ABNORMAL HIGH

## 2011-01-26 LAB — POCT HEMOGLOBIN-HEMACUE: Hemoglobin: 12.1

## 2011-01-27 ENCOUNTER — Other Ambulatory Visit (HOSPITAL_COMMUNITY): Payer: Self-pay | Admitting: Obstetrics and Gynecology

## 2011-01-27 DIAGNOSIS — D1803 Hemangioma of intra-abdominal structures: Secondary | ICD-10-CM

## 2011-01-31 ENCOUNTER — Ambulatory Visit (HOSPITAL_COMMUNITY)
Admission: RE | Admit: 2011-01-31 | Discharge: 2011-01-31 | Disposition: A | Discharge status: Home / Self Care | Payer: 59 | Source: Ambulatory Visit | Attending: Obstetrics and Gynecology | Admitting: Obstetrics and Gynecology

## 2011-01-31 DIAGNOSIS — K769 Liver disease, unspecified: Secondary | ICD-10-CM | POA: Insufficient documentation

## 2011-01-31 DIAGNOSIS — D1803 Hemangioma of intra-abdominal structures: Secondary | ICD-10-CM

## 2011-02-07 ENCOUNTER — Other Ambulatory Visit (HOSPITAL_COMMUNITY): Payer: Self-pay | Admitting: Obstetrics and Gynecology

## 2011-02-11 ENCOUNTER — Other Ambulatory Visit (HOSPITAL_COMMUNITY): Payer: Self-pay | Admitting: Obstetrics and Gynecology

## 2011-02-11 ENCOUNTER — Ambulatory Visit (HOSPITAL_COMMUNITY)
Admission: RE | Admit: 2011-02-11 | Discharge: 2011-02-11 | Disposition: A | Discharge status: Home / Self Care | Payer: BC Managed Care – PPO | Source: Ambulatory Visit | Attending: Obstetrics and Gynecology | Admitting: Obstetrics and Gynecology

## 2011-02-11 DIAGNOSIS — K7689 Other specified diseases of liver: Secondary | ICD-10-CM | POA: Insufficient documentation

## 2011-02-11 LAB — CREATININE, SERUM
GFR calc Af Amer: >90 mL/min (ref >90)
GFR calc non Af Amer: >90 mL/min (ref >90)

## 2011-02-11 MED ORDER — GADOBENATE DIMEGLUMINE 529 MG/ML IV SOLN
15.0000 mL | Freq: Once | INTRAVENOUS | Status: AC | PRN
  Administered 2011-02-11: 15 mL via INTRAVENOUS

## 2011-02-17 ENCOUNTER — Encounter: Payer: Self-pay | Admitting: Gastroenterology

## 2011-03-09 ENCOUNTER — Other Ambulatory Visit (INDEPENDENT_AMBULATORY_CARE_PROVIDER_SITE_OTHER): Payer: BC Managed Care – PPO

## 2011-03-09 ENCOUNTER — Ambulatory Visit (INDEPENDENT_AMBULATORY_CARE_PROVIDER_SITE_OTHER): Payer: BC Managed Care – PPO | Admitting: Gastroenterology

## 2011-03-09 ENCOUNTER — Encounter: Payer: Self-pay | Admitting: Gastroenterology

## 2011-03-09 DIAGNOSIS — K5900 Constipation, unspecified: Secondary | ICD-10-CM | POA: Insufficient documentation

## 2011-03-09 DIAGNOSIS — R933 Abnormal findings on diagnostic imaging of other parts of digestive tract: Secondary | ICD-10-CM | POA: Insufficient documentation

## 2011-03-09 LAB — HEPATIC FUNCTION PANEL
ALT: 13 U/L (ref 0–35)
AST: 19 U/L (ref 0–37)
Alkaline Phosphatase: 53 U/L (ref 39–117)
Bilirubin, Direct: 0 mg/dL (ref 0.0–0.3)
Total Bilirubin: 0.4 mg/dL (ref 0.3–1.2)

## 2011-03-09 NOTE — Assessment & Plan Note (Signed)
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  Medications #1 patient will consider enrollment in a constipation trial

## 2011-03-09 NOTE — Patient Instructions (Signed)
Please go to the basement today for your labs.  We will contact you to schedule an MRI of your liver in 3 months.  After your MRI you will need to come back for an office visit.  CC: Elfredia Nevins, MD

## 2011-03-09 NOTE — Assessment & Plan Note (Signed)
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  Recommendations #1 check LFTs #2 followup MRI in 3 months

## 2011-03-09 NOTE — Progress Notes (Signed)
History of Present Illness: Colleen Duncan is a pleasant 40 year old white female referred at the request of Dr. Sherwood Gambler for evaluation of an abnormal liver scan. While undergoing laparoscopy by her gynecologist for lower abdominal pain an incidental liver abnormality was noted. This was followed by an ultrasound and then MRI. The latter demonstrated a small mass with features most consistent with focal nodular hyperplasia. The patient has no GI symptoms including abdominal pain, jaundice or pruritus. She does not take oral contraceptives.  She complains of chronic constipation for which she takes MiraLax daily.    Past Medical History  Diagnosis Date  . PONV (postoperative nausea and vomiting)   . Hypertension     on inderal-instructed to take dos  . Seasonal allergies   . GERD (gastroesophageal reflux disease)     nexium-instructed to take dos  . Headache     migraines-treated with inderal along with treatment for htn  . Anxiety   . Depression   . Irritable bowel   . Interstitial cystitis    Past Surgical History  Procedure Date  . Abdominal hysterectomy     2003  . Knee arthroscopy [id number]  . Cholecystectomy 2003  . Ectopic pregnancy surgery 1993  . Laparoscopy 01/04/2011    Procedure: LAPAROSCOPY DIAGNOSTIC;  Surgeon: Melony Overly;  Location: [facility];  Service: Gynecology;  Laterality: N/A;  Bilateral salpingoectomy   . Neck surgery 2007   family history includes Diabetes in her mother and sister; Heart disease in her father; and Pancreatic cancer in her maternal grandmother. Current Outpatient Prescriptions  Medication Sig Dispense Refill  . ALPRAZolam (XANAX) 0.25 MG tablet Take 0.125 mg by mouth 2 (two) times daily as needed. For anxiety       . amitriptyline (ELAVIL) 10 MG tablet Take 10 mg by mouth at bedtime as needed. For anxiety       . cetirizine (ZYRTEC) 10 MG tablet Take 10 mg by mouth daily.        . diphenhydrAMINE (BENADRYL) 25 mg capsule Take 25 mg by mouth at bedtime as  needed. For allergies       . esomeprazole (NEXIUM) 40 MG capsule Take 40 mg by mouth daily before breakfast.        . hyoscyamine (LEVSIN SL) 0.125 MG SL tablet Place 0.125 mg under the tongue 2 (two) times daily as needed. For irritable bowel syndrome       . Ibuprofen (ADVIL) 200 MG CAPS Take 400 mg by mouth every 8 (eight) hours as needed. For pain        . oxyCODONE-acetaminophen (PERCOCET) 5-325 MG per tablet Take 1 tablet by mouth every 4 (four) hours as needed.        . propranolol (INDERAL) 20 MG tablet Take 20 mg by mouth daily.         Allergies as of 03/09/2011 - Review Complete 03/09/2011  Allergen Reaction Noted  . Morphine      reports that she has never smoked. She has never used smokeless tobacco. She reports that she drinks alcohol. She reports that she does not use illicit drugs.     Review of Systems: Pertinent positive and negative review of systems were noted in the above HPI section. All other review of systems were otherwise negative.  Vital signs were reviewed in today's medical record Physical Exam: General: Well developed , well nourished, no acute distress Head: Normocephalic and atraumatic Eyes:  sclerae anicteric, EOMI Ears: Normal auditory acuity Mouth: No deformity or  lesions Neck: Supple, no masses or thyromegaly Lungs: Clear throughout to auscultation Heart: Regular rate and rhythm; no murmurs, rubs or bruits Abdomen: Soft, non tender and non distended. No masses, hepatosplenomegaly or hernias noted. Normal Bowel sounds Rectal:deferred Musculoskeletal: Symmetrical with no gross deformities  Skin: No lesions on visible extremities Pulses:  Normal pulses noted Extremities: No clubbing, cyanosis, edema or deformities noted Neurological: Alert oriented x 4, grossly nonfocal Cervical Nodes:  No significant cervical adenopathy Inguinal Nodes: No significant inguinal adenopathy Psychological:  Alert and cooperative. Normal mood and affect

## 2011-03-11 NOTE — Progress Notes (Signed)
Quick Note:  Please inform the patient that lab work was normal and to continue current plan of action ______ 

## 2011-03-14 ENCOUNTER — Telehealth: Payer: Self-pay

## 2011-03-14 NOTE — Telephone Encounter (Signed)
Message copied by Michele Mcalpine on Mon Mar 14, 2011  9:31 AM ------      Message from: Melvia Heaps D      Created: Fri Mar 11, 2011  9:23 AM       Please inform the patient that lab work was normal and to continue current plan of action

## 2011-03-14 NOTE — Telephone Encounter (Signed)
Message left for pt regarding results. 

## 2011-05-16 ENCOUNTER — Telehealth: Payer: Self-pay | Admitting: *Deleted

## 2011-05-16 DIAGNOSIS — R9389 Abnormal findings on diagnostic imaging of other specified body structures: Secondary | ICD-10-CM

## 2011-05-16 NOTE — Telephone Encounter (Signed)
L/M for pt that her MRI is scheduled on Friday Feb 8th at 10am Gave pt all information needed and asked pt if she is pregnant please contact the office and let us know.

## 2011-05-16 NOTE — Telephone Encounter (Signed)
Message copied by Marlowe Kays on Mon May 16, 2011  8:51 AM ------      Message from: Harlow Mares D      Created: Wed Mar 09, 2011 11:47 AM       Needs mri of liver and office visit after resluts are back.

## 2011-05-20 ENCOUNTER — Inpatient Hospital Stay (HOSPITAL_COMMUNITY): Admission: RE | Admit: 2011-05-20 | Payer: BC Managed Care – PPO | Source: Ambulatory Visit

## 2011-05-23 NOTE — Telephone Encounter (Signed)
Just inform her that f/u MRI is recommended .

## 2011-05-23 NOTE — Telephone Encounter (Signed)
FYI-Patient cancelled follow up MRI that was scheduled on 05/20/2011

## 2011-05-25 ENCOUNTER — Encounter: Payer: Self-pay | Admitting: Internal Medicine

## 2011-05-26 ENCOUNTER — Ambulatory Visit (INDEPENDENT_AMBULATORY_CARE_PROVIDER_SITE_OTHER): Payer: BC Managed Care – PPO | Admitting: Gastroenterology

## 2011-05-26 ENCOUNTER — Encounter: Payer: Self-pay | Admitting: Gastroenterology

## 2011-05-26 DIAGNOSIS — K769 Liver disease, unspecified: Secondary | ICD-10-CM

## 2011-05-26 DIAGNOSIS — K589 Irritable bowel syndrome without diarrhea: Secondary | ICD-10-CM

## 2011-05-26 DIAGNOSIS — K649 Unspecified hemorrhoids: Secondary | ICD-10-CM

## 2011-05-26 DIAGNOSIS — R160 Hepatomegaly, not elsewhere classified: Secondary | ICD-10-CM | POA: Insufficient documentation

## 2011-05-26 DIAGNOSIS — K219 Gastro-esophageal reflux disease without esophagitis: Secondary | ICD-10-CM

## 2011-05-26 MED ORDER — HYDROCORTISONE 2.5 % RE CREA: TOPICAL_CREAM | RECTAL | Status: DC

## 2011-05-26 NOTE — Telephone Encounter (Signed)
Called pt to inform that she needed to reschedule her MRI.Marland KitchenMarland KitchenMarland KitchenShe stated "I know what I need to do, and I will take care of it a different way"  I informed her of the importance of getting this done.....  FYI- Dr Arlyce Dice

## 2011-05-26 NOTE — Progress Notes (Deleted)
Gained 30 lbs since surgery. Ovaries remain. Stopped amityityline

## 2011-05-26 NOTE — Progress Notes (Signed)
Primary Care Physician: Cassell Smiles., MD, MD  Primary Gastroenterologist:  Roetta Sessions, MD   Chief Complaint  Patient presents with  . Follow-up    h/o liver lesion    HPI: Colleen Duncan is a 41 y.o. female here to followup liver lesion found at time of GYN surgery last year. She had laparoscopic bilateral salpingectomy, excision of pelvic adhesions, excision of perineal condyloma. During her surgery it was noted that she had an area of the left hepatic lobe approximately 2 cm which appeared abnormal. This could not be picked up on abdominal ultrasound however. On MRI of the abdomen in October 2012 she had a 1.6 cm subscapular lesion identified in the inferior most aspect of the lateral segment left liver which corresponded to the lesion seen at time of surgery. Characteristics were suggestive of focal nodular hyperplasia. Followup MRI in 3 months is recommended. Patient states that she was referred to Dr. Melvia Heaps for this particular lesion. She saw him once but because she had established care with our office over the years she preferred to continue followup of this liver lesion with [name].  Overall she's been doing well since her surgery. She states she has much less abdominal pain. Her bladder is emptying better. Her GERD is well controlled. She has had a hemorrhoid which comes out with her bowel movement. She has to push it back in. She wants medication for this. Denies any abdominal pain, vomiting, melena, rectal bleeding. Bowel movements have been regular.  C/O 30 pound weight gain since her surgery. States she is finally able to eat without pain.  Current Outpatient Prescriptions  Medication Sig Dispense Refill  . ALPRAZolam (XANAX) 0.25 MG tablet Take 0.125 mg by mouth 2 (two) times daily as needed. For anxiety       . diphenhydrAMINE (BENADRYL) 25 mg capsule Take 25 mg by mouth at bedtime as needed. For allergies       . esomeprazole (NEXIUM) 40 MG capsule Take 40 mg by mouth  daily before breakfast.        . Ibuprofen (ADVIL) 200 MG CAPS Take 400 mg by mouth every 8 (eight) hours as needed. For pain        . loratadine (CLARITIN) 10 MG tablet Take 10 mg by mouth daily.      Marland Kitchen oxyCODONE-acetaminophen (PERCOCET) 5-325 MG per tablet Take 1 tablet by mouth every 4 (four) hours as needed. About two at night      . polyethylene glycol (MIRALAX / GLYCOLAX) packet Take 17 g by mouth daily.      . propranolol (INDERAL) 20 MG tablet Take 20 mg by mouth daily.        . hydrocortisone (ANUSOL-HC) 2.5 % rectal cream Apply rectally 2 times daily X 14 days  30 g  1    Allergies as of 05/26/2011 - Review Complete 05/26/2011  Allergen Reaction Noted  . Morphine      ROS:  General: Negative for anorexia, weight loss, fever, chills, fatigue, weakness. ENT: Negative for hoarseness, difficulty swallowing , nasal congestion. CV: Negative for chest pain, angina, palpitations, dyspnea on exertion, peripheral edema.  Respiratory: Negative for dyspnea at rest, dyspnea on exertion, cough, sputum, wheezing.  GI: See history of present illness. GU:  Negative for dysuria, hematuria, urinary incontinence, urinary frequency, nocturnal urination.  Endo: Negative for unusual weight change.    Physical Examination:   BP 145/89  Pulse 74  Temp(Src) 98.4 F (36.9 C) (Temporal)  Ht 5\' 6"  (  1.676 m)  Wt 170 lb 9.6 oz (77.384 kg)  BMI 27.54 kg/m2  General: Well-nourished, well-developed in no acute distress.  Eyes: No icterus. Mouth: Oropharyngeal mucosa moist and pink , no lesions erythema or exudate. Lungs: Clear to auscultation bilaterally.  Heart: Regular rate and rhythm, no murmurs rubs or gallops.  Abdomen: Bowel sounds are normal, nontender, nondistended, no hepatosplenomegaly or masses, no abdominal bruits or hernia , no rebound or guarding.   Extremities: No lower extremity edema. No clubbing or deformities. Neuro: Alert and oriented x 4   Skin: Warm and dry, no jaundice.     Psych: Alert and cooperative, normal mood and affect.

## 2011-05-27 ENCOUNTER — Encounter: Payer: Self-pay | Admitting: Gastroenterology

## 2011-05-27 LAB — HEPATIC FUNCTION PANEL
ALT: 23 U/L (ref 0–35)
AST: 19 U/L (ref 0–37)
Albumin: 4.5 g/dL (ref 3.5–5.2)
Alkaline Phosphatase: 60 U/L (ref 39–117)
Total Protein: 7 g/dL (ref 6.0–8.3)

## 2011-05-27 LAB — AFP TUMOR MARKER: AFP-Tumor Marker: 2.8 ng/mL (ref 0.0–8.0)

## 2011-05-27 NOTE — Telephone Encounter (Signed)
ok 

## 2011-05-27 NOTE — Assessment & Plan Note (Signed)
Doing well 

## 2011-05-27 NOTE — Assessment & Plan Note (Signed)
F/U MRI liver with and without contrast. Baseline AFP tumor marker. Recheck LFTs.

## 2011-05-27 NOTE — Assessment & Plan Note (Signed)
anusol H-C cream PR BID for two weeks.

## 2011-05-27 NOTE — Assessment & Plan Note (Addendum)
Doing well. Continue current regimen.  

## 2011-05-30 NOTE — Progress Notes (Signed)
Faxed to PCP

## 2011-05-30 NOTE — Progress Notes (Signed)
Quick Note:  Labs look good. Await MRI. ______

## 2011-05-31 ENCOUNTER — Ambulatory Visit (HOSPITAL_COMMUNITY)
Admission: RE | Admit: 2011-05-31 | Discharge: 2011-05-31 | Disposition: A | Discharge status: Home / Self Care | Payer: BC Managed Care – PPO | Source: Ambulatory Visit | Attending: Gastroenterology | Admitting: Gastroenterology

## 2011-05-31 DIAGNOSIS — R932 Abnormal findings on diagnostic imaging of liver and biliary tract: Secondary | ICD-10-CM | POA: Insufficient documentation

## 2011-05-31 DIAGNOSIS — R160 Hepatomegaly, not elsewhere classified: Secondary | ICD-10-CM

## 2011-05-31 DIAGNOSIS — K7689 Other specified diseases of liver: Secondary | ICD-10-CM | POA: Insufficient documentation

## 2011-05-31 MED ORDER — GADOBENATE DIMEGLUMINE 529 MG/ML IV SOLN
15.0000 mL | Freq: Once | INTRAVENOUS | Status: AC | PRN
  Administered 2011-05-31: 15 mL via INTRAVENOUS

## 2011-06-07 NOTE — Progress Notes (Signed)
Quick Note:  Mailed letter to pt ______ 

## 2011-06-13 NOTE — Progress Notes (Signed)
Quick Note:  Discussed with RMR. Please let patient know.  Liver lesion stable. Benign focal nodular hyperplasia favored etiology. Radiologist/RMR recommend the following:  MRI Abdomen/Liver with EOVIST contrast medium rather than standard contrast in six months. Let's also repeat AFP tumor marker in three months. ______ 

## 2011-06-13 NOTE — Progress Notes (Signed)
Quick Note:  Discussed with RMR. Please let patient know.  Liver lesion stable. Benign focal nodular hyperplasia favored etiology. Radiologist/RMR recommend the following:  MRI Abdomen/Liver with EOVIST contrast medium rather than standard contrast in six months. Let's also repeat AFP tumor marker in three months. ______

## 2011-06-30 ENCOUNTER — Other Ambulatory Visit: Payer: Self-pay | Admitting: Gastroenterology

## 2011-06-30 ENCOUNTER — Telehealth: Payer: Self-pay | Admitting: Gastroenterology

## 2011-06-30 ENCOUNTER — Telehealth: Payer: Self-pay | Admitting: General Practice

## 2011-06-30 DIAGNOSIS — K769 Liver disease, unspecified: Secondary | ICD-10-CM

## 2011-06-30 NOTE — Telephone Encounter (Signed)
I did inform the patient that MRI was stable and she needed to repeat it in 6 months and that she needed to repeat labs in 3 months

## 2011-06-30 NOTE — Telephone Encounter (Signed)
I called the patient to explain to her that this was a clear oversight on our part on getting MRI results.  Patient voiced understanding and was very pleased that I called.

## 2011-06-30 NOTE — Telephone Encounter (Signed)
Colleen Duncan called patient to explain oversight.  Looks like signed off on by nursing staff without phone call to patient.

## 2011-06-30 NOTE — Telephone Encounter (Signed)
Colleen Duncan informed pt of results. Lab order on file.

## 2011-06-30 NOTE — Telephone Encounter (Signed)
Patient called to get MRI results from couple months ago and shes not happy that noone has called her back to inform her of results and she states that if we cant keep up and keep her informed she will seek medical attention else where.

## 2011-08-11 ENCOUNTER — Other Ambulatory Visit: Payer: Self-pay

## 2011-08-11 DIAGNOSIS — K769 Liver disease, unspecified: Secondary | ICD-10-CM

## 2011-08-24 NOTE — Progress Notes (Signed)
Pt called back and I am going to mail her out a copy for the labs because she stated that she never got them.

## 2011-08-24 NOTE — Progress Notes (Signed)
LMOM to call back

## 2011-09-01 ENCOUNTER — Other Ambulatory Visit: Payer: Self-pay

## 2011-09-01 ENCOUNTER — Other Ambulatory Visit: Payer: Self-pay | Admitting: Gastroenterology

## 2011-09-01 DIAGNOSIS — K769 Liver disease, unspecified: Secondary | ICD-10-CM

## 2011-09-01 NOTE — Progress Notes (Signed)
Quick Note:  Please make sure we inform patient the results. We overlooked doing this last time and patient understandably upset. AFP stable/normal. Recommend repeat LFTs and AFP prior to MRI in 12/2011. OV with RMR only 12/2011. ______

## 2011-09-01 NOTE — Progress Notes (Signed)
Quick Note:  Lab order on file.  Darl Pikes, please nic ov with RMR only and MRI in 12/2011 ______

## 2011-09-06 ENCOUNTER — Other Ambulatory Visit: Payer: Self-pay | Admitting: Obstetrics and Gynecology

## 2011-09-16 ENCOUNTER — Telehealth: Payer: Self-pay | Admitting: Internal Medicine

## 2011-09-16 NOTE — Telephone Encounter (Signed)
Pt is on June recall for 3 month labs

## 2011-09-16 NOTE — Telephone Encounter (Signed)
[facility] has sent letter and lab orders to pt.

## 2011-11-04 ENCOUNTER — Encounter (HOSPITAL_COMMUNITY): Payer: Self-pay | Admitting: *Deleted

## 2011-11-04 ENCOUNTER — Emergency Department (HOSPITAL_COMMUNITY)
Admission: EM | Admit: 2011-11-04 | Discharge: 2011-11-04 | Disposition: A | Discharge status: Home / Self Care | Payer: BC Managed Care – PPO | Attending: Emergency Medicine | Admitting: Emergency Medicine

## 2011-11-04 DIAGNOSIS — K219 Gastro-esophageal reflux disease without esophagitis: Secondary | ICD-10-CM | POA: Insufficient documentation

## 2011-11-04 DIAGNOSIS — I10 Essential (primary) hypertension: Secondary | ICD-10-CM | POA: Insufficient documentation

## 2011-11-04 LAB — POCT I-STAT, CHEM 8
BUN: 13 mg/dL (ref 6–23)
HCT: 36 % (ref 36.0–46.0)
Sodium: 141 mEq/L (ref 135–145)
TCO2: 22 mmol/L (ref 0–100)

## 2011-11-04 LAB — POCT I-STAT TROPONIN I: Troponin i, poc: 0 ng/mL (ref 0.00–0.08)

## 2011-11-04 MED ORDER — PROPRANOLOL HCL 20 MG PO TABS: 20.0000 mg | ORAL_TABLET | Freq: Two times a day (BID) | ORAL | Status: DC

## 2011-11-04 NOTE — ED Notes (Signed)
Pt reports went to pain management clinic and BP elevated. Reports headache, dizziness, denies blurred vision.

## 2011-11-04 NOTE — Discharge Instructions (Signed)
Your EKG does not show signs of a heart attack.  Your blood tests are normal.  Followup with your Dr. for reevaluation.  Return for worse or uncontrolled symptoms

## 2011-11-04 NOTE — ED Provider Notes (Signed)
History     CSN: [id number]  Arrival date & time 11/04/11  1213   First MD Initiated Contact with Patient 11/04/11 1527      Chief Complaint  Patient presents with  . Hypertension    (Consider location/radiation/quality/duration/timing/severity/associated sxs/prior treatment) Patient is a 41 y.o. female presenting with hypertension. The history is provided by the patient.  Hypertension Pertinent negatives include no abdominal pain, no headaches and no shortness of breath.  She c/o no energy and chest tightness since yesterday.  Chest tightness was constant all day yesterday.   Felt bad so checked bp. It was high so she called her pcp and was told to come here. Denies n/v/f/c/cough/sob/sweating. No leg pain or swelling.  Denies smoking.  Denies dm. No hx of AMI.     Past Medical History  Diagnosis Date  . PONV (postoperative nausea and vomiting)   . Hypertension     on inderal-instructed to take dos  . Seasonal allergies   . GERD (gastroesophageal reflux disease)     nexium-instructed to take dos  . Headache     migraines-treated with inderal along with treatment for htn  . Anxiety   . Depression   . Irritable bowel   . Interstitial cystitis   . Liver lesion     ?focal nodular hyperplasia, followed via MRI    Past Surgical History  Procedure Date  . Abdominal hysterectomy     2003  . Knee arthroscopy [id number]  . Cholecystectomy 2003  . Ectopic pregnancy surgery 1993  . Laparoscopy 01/04/2011    Procedure: LAPAROSCOPY DIAGNOSTIC;  Surgeon: Melony Overly;  Location: [facility];  Service: Gynecology;  Laterality: N/A;  Bilateral salpingoectomy   . Neck surgery 2007  . Colonoscopy 2003    internal hemorrhoids  . Esophagogastroduodenoscopy 06/18/07    normal  . Bravo ph study 06/2007    good control on BID PPI    Family History  Problem Relation Age of Onset  . Pancreatic cancer Maternal Grandmother   . Diabetes Mother   . Heart disease Father   . Diabetes Sister      History  Substance Use Topics  . Smoking status: Never Smoker   . Smokeless tobacco: Never Used  . Alcohol Use: Yes     2 per day    OB History    Grav Para Term Preterm Abortions TAB SAB Ect Mult Living                  Review of Systems  Constitutional: Negative for fever and chills.  HENT: Negative for congestion.   Respiratory: Negative for cough and shortness of breath.   Gastrointestinal: Negative for nausea, vomiting and abdominal pain.  Skin: Negative for rash.  Neurological: Negative for headaches.  Psychiatric/Behavioral: Negative for confusion.  All other systems reviewed and are negative.    Allergies  Morphine  Home Medications   Current Outpatient Rx  Name Route Sig Dispense Refill  . ALPRAZOLAM 0.5 MG PO TABS Oral Take 0.5 mg by mouth 3 (three) times daily as needed. For anxiety.    Marland Kitchen DIPHENHYDRAMINE HCL 25 MG PO CAPS Oral Take 25 mg by mouth at bedtime as needed. For allergies     . ESOMEPRAZOLE MAGNESIUM 40 MG PO CPDR Oral Take 40 mg by mouth daily before breakfast.     . IBUPROFEN 200 MG PO TABS Oral Take 400-600 mg by mouth every 8 (eight) hours as needed. For pain.    Marland Kitchen LORATADINE  10 MG PO TABS Oral Take 10 mg by mouth daily.    Marland Kitchen URIBEL 118 MG PO CAPS Oral Take 1 capsule by mouth 3 (three) times daily.    . OXYCODONE-ACETAMINOPHEN 5-325 MG PO TABS Oral Take 1 tablet by mouth every 4 (four) hours as needed. For pain.    Marland Kitchen PENTOSAN POLYSULFATE SODIUM 100 MG PO CAPS Oral Take 100 mg by mouth daily.    Marland Kitchen POLYETHYLENE GLYCOL 3350 PO PACK Oral Take 17 g by mouth daily as needed. For constipation.    Marland Kitchen PROPRANOLOL HCL 20 MG PO TABS Oral Take 20 mg by mouth 2 (two) times daily.       BP 154/88  Pulse 96  Temp 98.9 F (37.2 C) (Oral)  Resp 18  SpO2 100%  Physical Exam  Nursing note and vitals reviewed. Constitutional: She is oriented to person, place, and time. She appears well-developed and well-nourished. No distress.  HENT:  Head:  Normocephalic and atraumatic.  Eyes: Conjunctivae are normal.  Neck: Normal range of motion. Neck supple. No JVD present.       No bruits  Cardiovascular: Normal rate.   No murmur heard. Pulmonary/Chest: Effort normal and breath sounds normal.  Abdominal: Soft. There is no tenderness.  Musculoskeletal: Normal range of motion. She exhibits no edema and no tenderness.  Neurological: She is alert and oriented to person, place, and time.  Skin: Skin is warm and dry.  Psychiatric: She has a normal mood and affect. Thought content normal.    ED Course  Procedures (including critical care time) 41 year old, female, with history of hypertension, and no other significant illnesses, presents with malaise, decreased energy, and chest tightness since yesterday.  She has no significant risk factors for coronary disease.  We will perform an EKG, and laboratory testing, for evaluation.   Labs Reviewed  POCT I-STAT, CHEM 8  POCT I-STAT TROPONIN I   No results found.   No diagnosis found.  BP 126/67  Pulse 82  Temp 98.2 F (36.8 C) (Oral)  Resp 13  SpO2 100%      Date: 11/04/2011  Rate: 78  Rhythm: normal sinus rhythm  QRS Axis: normal  Intervals: normal  ST/T Wave abnormalities: normal  Conduction Disutrbances: none  Narrative Interpretation: unremarkable                                                              MDM  Hypertension- resolved        Cheri Guppy, MD 11/04/11 1708

## 2011-11-23 ENCOUNTER — Telehealth: Payer: Self-pay | Admitting: Internal Medicine

## 2011-11-23 NOTE — Telephone Encounter (Signed)
MRI Abdomen/Liver with EOVIST contrast medium rather than standard contrast in six months.

## 2011-11-25 ENCOUNTER — Telehealth: Payer: Self-pay | Admitting: Gastroenterology

## 2011-11-25 NOTE — Telephone Encounter (Signed)
Patient overdue for AFP, LFTs. Also due MRI, see recall.

## 2011-11-25 NOTE — Telephone Encounter (Signed)
Letter and labs have already been sent to pt for her to have done on September 2nd.  Routing to [facility] to check radiology recall

## 2011-11-28 ENCOUNTER — Other Ambulatory Visit: Payer: Self-pay | Admitting: Gastroenterology

## 2011-11-28 DIAGNOSIS — K769 Liver disease, unspecified: Secondary | ICD-10-CM

## 2011-11-28 NOTE — Telephone Encounter (Signed)
LMOM For patient to return my call to schedule

## 2011-11-29 ENCOUNTER — Other Ambulatory Visit: Payer: Self-pay | Admitting: Gastroenterology

## 2011-11-29 NOTE — Telephone Encounter (Signed)
I have tried numerous times to reach Colleen Duncan to schedule her MRI and she hasnt returned my call

## 2011-11-29 NOTE — Telephone Encounter (Signed)
Letter has been mailed to the patient 

## 2011-12-20 ENCOUNTER — Other Ambulatory Visit: Payer: Self-pay

## 2011-12-21 ENCOUNTER — Telehealth: Payer: Self-pay | Admitting: Internal Medicine

## 2011-12-21 ENCOUNTER — Other Ambulatory Visit: Payer: Self-pay | Admitting: Neurology

## 2011-12-21 ENCOUNTER — Encounter: Payer: Self-pay | Admitting: Internal Medicine

## 2011-12-21 DIAGNOSIS — R2681 Unsteadiness on feet: Secondary | ICD-10-CM

## 2011-12-21 DIAGNOSIS — R51 Headache: Secondary | ICD-10-CM

## 2011-12-21 MED ORDER — HYOSCYAMINE SULFATE 0.125 MG SL SUBL: 0.1250 mg | SUBLINGUAL_TABLET | SUBLINGUAL | Status: DC | PRN

## 2011-12-21 NOTE — Telephone Encounter (Signed)
Letter has been mailed to the patient 

## 2011-12-21 NOTE — Telephone Encounter (Signed)
Sept recall has that the patient will need MRI abd/pelvis with EOVIST contrast medium rather than standard contrast in 6 months and OV with RMR ONLY

## 2011-12-22 LAB — HEPATIC FUNCTION PANEL
ALT: 9 U/L (ref 0–35)
Albumin: 4.7 g/dL (ref 3.5–5.2)
Bilirubin, Direct: 0.1 mg/dL (ref 0.0–0.3)
Total Bilirubin: 0.6 mg/dL (ref 0.3–1.2)

## 2011-12-22 LAB — AFP TUMOR MARKER: AFP-Tumor Marker: 3.4 ng/mL (ref 0.0–8.0)

## 2011-12-22 NOTE — Progress Notes (Signed)
Quick Note:  Fine with me as long as they use EOVIST contrast. ______

## 2011-12-22 NOTE — Progress Notes (Signed)
Quick Note:  LFTs and AFP are normal. Still needs to schedule her OV with RMR only and MRI with EOVIST. Patient has been unreachable by phone. ______

## 2011-12-23 NOTE — Progress Notes (Signed)
Quick Note:  Darl Pikes, please make appt with RMR only. ______

## 2011-12-29 ENCOUNTER — Encounter: Payer: Self-pay | Admitting: Internal Medicine

## 2011-12-30 ENCOUNTER — Encounter (HOSPITAL_COMMUNITY): Payer: Self-pay | Admitting: *Deleted

## 2011-12-30 ENCOUNTER — Ambulatory Visit (HOSPITAL_COMMUNITY)
Admission: RE | Admit: 2011-12-30 | Discharge: 2011-12-30 | Disposition: A | Discharge status: Home / Self Care | Payer: BC Managed Care – PPO | Source: Ambulatory Visit | Attending: Neurology | Admitting: Neurology

## 2011-12-30 ENCOUNTER — Ambulatory Visit (HOSPITAL_COMMUNITY)
Admission: RE | Admit: 2011-12-30 | Discharge: 2011-12-30 | Disposition: A | Discharge status: Home / Self Care | Payer: BC Managed Care – PPO | Source: Ambulatory Visit | Attending: Gastroenterology | Admitting: Gastroenterology

## 2011-12-30 ENCOUNTER — Other Ambulatory Visit: Payer: Self-pay

## 2011-12-30 ENCOUNTER — Emergency Department (HOSPITAL_COMMUNITY)
Admission: EM | Admit: 2011-12-30 | Discharge: 2011-12-30 | Disposition: A | Discharge status: Home / Self Care | Payer: BC Managed Care – PPO | Attending: Emergency Medicine | Admitting: Emergency Medicine

## 2011-12-30 DIAGNOSIS — R42 Dizziness and giddiness: Secondary | ICD-10-CM | POA: Insufficient documentation

## 2011-12-30 DIAGNOSIS — R2681 Unsteadiness on feet: Secondary | ICD-10-CM

## 2011-12-30 DIAGNOSIS — R5381 Other malaise: Secondary | ICD-10-CM | POA: Insufficient documentation

## 2011-12-30 DIAGNOSIS — K7689 Other specified diseases of liver: Secondary | ICD-10-CM | POA: Insufficient documentation

## 2011-12-30 DIAGNOSIS — E876 Hypokalemia: Secondary | ICD-10-CM | POA: Insufficient documentation

## 2011-12-30 DIAGNOSIS — R269 Unspecified abnormalities of gait and mobility: Secondary | ICD-10-CM | POA: Insufficient documentation

## 2011-12-30 DIAGNOSIS — R5383 Other fatigue: Secondary | ICD-10-CM | POA: Insufficient documentation

## 2011-12-30 DIAGNOSIS — I10 Essential (primary) hypertension: Secondary | ICD-10-CM | POA: Insufficient documentation

## 2011-12-30 DIAGNOSIS — Z79899 Other long term (current) drug therapy: Secondary | ICD-10-CM | POA: Insufficient documentation

## 2011-12-30 DIAGNOSIS — F411 Generalized anxiety disorder: Secondary | ICD-10-CM | POA: Insufficient documentation

## 2011-12-30 DIAGNOSIS — H538 Other visual disturbances: Secondary | ICD-10-CM | POA: Insufficient documentation

## 2011-12-30 DIAGNOSIS — R51 Headache: Secondary | ICD-10-CM

## 2011-12-30 DIAGNOSIS — K769 Liver disease, unspecified: Secondary | ICD-10-CM

## 2011-12-30 LAB — CBC WITH DIFFERENTIAL/PLATELET
Basophils Absolute: 0.1 10*3/uL (ref 0.0–0.1)
Eosinophils Relative: 2 % (ref 0–5)
Lymphocytes Relative: 19 % (ref 12–46)
Lymphs Abs: 1.4 10*3/uL (ref 0.7–4.0)
Neutro Abs: 5 10*3/uL (ref 1.7–7.7)
Neutrophils Relative %: 66 % (ref 43–77)
Platelets: 323 10*3/uL (ref 150–400)
RBC: 4.2 MIL/uL (ref 3.87–5.11)
RDW: 13.8 % (ref 11.5–15.5)
WBC: 7.6 10*3/uL (ref 4.0–10.5)

## 2011-12-30 LAB — BASIC METABOLIC PANEL
CO2: 29 mEq/L (ref 19–32)
Calcium: 9.7 mg/dL (ref 8.4–10.5)
Creatinine, Ser: 0.68 mg/dL (ref 0.50–1.10)
GFR calc Af Amer: >90 mL/min (ref >90)
GFR calc non Af Amer: >90 mL/min (ref >90)
Sodium: 135 mEq/L (ref 135–145)

## 2011-12-30 LAB — POCT I-STAT, CHEM 8
Calcium, Ion: 1.02 mmol/L — ABNORMAL LOW (ref 1.12–1.23)
Chloride: 101 mEq/L (ref 96–112)
Glucose, Bld: 108 mg/dL — ABNORMAL HIGH (ref 70–99)
HCT: 36 % (ref 36.0–46.0)
Hemoglobin: 12.2 g/dL (ref 12.0–15.0)
TCO2: 25 mmol/L (ref 0–100)

## 2011-12-30 MED ORDER — POTASSIUM CHLORIDE 10 MEQ/100ML IV SOLN
10.0000 meq | Freq: Once | INTRAVENOUS | Status: AC
Start: 2011-12-30 — End: 2011-12-30
  Administered 2011-12-30: 10 meq via INTRAVENOUS
  Filled 2011-12-30: qty 100

## 2011-12-30 MED ORDER — POTASSIUM CHLORIDE ER 10 MEQ PO TBCR: 10.0000 meq | EXTENDED_RELEASE_TABLET | Freq: Every day | ORAL | Status: DC

## 2011-12-30 MED ORDER — GADOXETATE DISODIUM 0.25 MMOL/ML IV SOLN
8.0000 mL | Freq: Once | INTRAVENOUS | Status: AC | PRN
  Administered 2011-12-30: 8 mL via INTRAVENOUS

## 2011-12-30 MED ORDER — POTASSIUM CHLORIDE CRYS ER 20 MEQ PO TBCR
40.0000 meq | EXTENDED_RELEASE_TABLET | Freq: Once | ORAL | Status: AC
  Administered 2011-12-30: 40 meq via ORAL
  Filled 2011-12-30: qty 2

## 2011-12-30 NOTE — ED Provider Notes (Addendum)
History   This chart was scribed for Geoffery Lyons, MD by Gerlean Ren. This patient was seen in room APA16A/APA16A and the patient's care was started at 1:40PM.   CSN: [id number]  Arrival date & time 12/30/11  1307   First MD Initiated Contact with Patient 12/30/11 1337      Chief Complaint  Patient presents with  . Abnormal Lab    (Consider location/radiation/quality/duration/timing/severity/associated sxs/prior treatment) The history is provided by the patient. No language interpreter was used.  Colleen Duncan is a 41 y.o. female who presents to the Emergency Department referred after blood test showed low potassium while receiving MRI this morning.  Pt received MRI for reported dizziness, fatigue, and anxiety.  Pt denies associated palpitations and tremors.  Pt has h/o HTN, depression, and anxiety.   Past Medical History  Diagnosis Date  . PONV (postoperative nausea and vomiting)   . Hypertension     on inderal-instructed to take dos  . Seasonal allergies   . GERD (gastroesophageal reflux disease)     nexium-instructed to take dos  . Headache     migraines-treated with inderal along with treatment for htn  . Anxiety   . Depression   . Irritable bowel   . Interstitial cystitis   . Liver lesion     ?focal nodular hyperplasia, followed via MRI    Past Surgical History  Procedure Date  . Abdominal hysterectomy     2003  . Knee arthroscopy [id number]  . Cholecystectomy 2003  . Ectopic pregnancy surgery 1993  . Laparoscopy 01/04/2011    Procedure: LAPAROSCOPY DIAGNOSTIC;  Surgeon: Melony Overly;  Location: [facility];  Service: Gynecology;  Laterality: N/A;  Bilateral salpingoectomy   . Neck surgery 2007  . Colonoscopy 2003    internal hemorrhoids  . Esophagogastroduodenoscopy 06/18/07    normal  . Bravo ph study 06/2007    good control on BID PPI  . Kidney stone surgery   . Tonsillectomy     Family History  Problem Relation Age of Onset  . Pancreatic cancer Maternal  Grandmother   . Diabetes Mother   . Heart disease Father   . Diabetes Sister     History  Substance Use Topics  . Smoking status: Never Smoker   . Smokeless tobacco: Never Used  . Alcohol Use: Yes     2 per day    OB History    Grav Para Term Preterm Abortions TAB SAB Ect Mult Living                  Review of Systems  All other systems reviewed and are negative.    Allergies  Morphine  Home Medications   Current Outpatient Rx  Name Route Sig Dispense Refill  . ALPRAZOLAM 0.5 MG PO TABS Oral Take 0.5 mg by mouth 3 (three) times daily as needed. For anxiety.    Marland Kitchen DIPHENHYDRAMINE HCL 25 MG PO CAPS Oral Take 25 mg by mouth at bedtime as needed. For allergies     . ESOMEPRAZOLE MAGNESIUM 40 MG PO CPDR Oral Take 40 mg by mouth daily before breakfast.     . HYOSCYAMINE SULFATE 0.125 MG SL SUBL Sublingual Place 1 tablet (0.125 mg total) under the tongue every 4 (four) hours as needed for cramping. 120 tablet 0  . IBUPROFEN 200 MG PO TABS Oral Take 400-600 mg by mouth every 8 (eight) hours as needed. For pain.    Marland Kitchen LORATADINE 10 MG PO TABS Oral Take  10 mg by mouth daily.    Marland Kitchen URIBEL 118 MG PO CAPS Oral Take 1 capsule by mouth 3 (three) times daily.    . OXYCODONE-ACETAMINOPHEN 5-325 MG PO TABS Oral Take 1 tablet by mouth every 4 (four) hours as needed. For pain.    Marland Kitchen PENTOSAN POLYSULFATE SODIUM 100 MG PO CAPS Oral Take 100 mg by mouth daily.    Marland Kitchen POLYETHYLENE GLYCOL 3350 PO PACK Oral Take 17 g by mouth daily as needed. For constipation.    Marland Kitchen PROPRANOLOL HCL 20 MG PO TABS Oral Take 20 mg by mouth 2 (two) times daily.     Marland Kitchen PROPRANOLOL HCL 20 MG PO TABS Oral Take 1 tablet (20 mg total) by mouth 2 (two) times daily. 60 tablet 3    BP 140/82  Pulse 86  Temp 98.1 F (36.7 C) (Oral)  Resp 20  Ht 5\' 6"  (1.676 m)  Wt 149 lb (67.586 kg)  BMI 24.05 kg/m2  SpO2 100%  Physical Exam  Nursing note and vitals reviewed. Constitutional: She is oriented to person, place, and time. She  appears well-developed and well-nourished.  HENT:  Head: Normocephalic and atraumatic.  Eyes: Conjunctivae normal and EOM are normal.  Neck: Neck supple. No tracheal deviation present.  Cardiovascular: Normal rate, regular rhythm and normal heart sounds.   No murmur heard. Pulmonary/Chest: Effort normal and breath sounds normal. She has no wheezes.  Abdominal: Soft. Bowel sounds are normal.  Musculoskeletal: Normal range of motion. She exhibits no edema.  Neurological: She is alert and oriented to person, place, and time.  Skin: Skin is warm and dry.  Psychiatric: She has a normal mood and affect.    ED Course  Procedures (including critical care time) DIAGNOSTIC STUDIES: Oxygen Saturation is 100% on room air, normal by my interpretation.    COORDINATION OF CARE: 1:43PM- Ordered basic met, CBC.    Labs Reviewed  BASIC METABOLIC PANEL  CBC WITH DIFFERENTIAL   Mr Brain Wo Contrast  12/30/2011  *RADIOLOGY REPORT*  Clinical Data: 41 year old female with new onset of dizziness, off balance, blurred vision, confusion, elevated blood pressure.  Head injury in 2012 with loss of consciousness not otherwise specified.  MRI HEAD WITHOUT CONTRAST  Technique:  Multiplanar, multiecho pulse sequences of the brain and surrounding structures were obtained according to standard protocol without intravenous contrast.  Comparison: Cervical spine CTs and MRIs 09/19/2010 and earlier.  Findings: Normal cerebral volume. No restricted diffusion to suggest acute infarction.  No midline shift, mass effect, evidence of mass lesion, ventriculomegaly, extra-axial collection or acute intracranial hemorrhage.  Cervicomedullary junction and pituitary are within normal limits.  Major intracranial vascular flow voids are preserved. Wallace Cullens and white matter signal is within normal limits throughout the brain.   No evidence of chronic blood products in the brain. Negative visualized upper cervical spine.  Normal marrow  signal.  Grossly normal visualized internal auditory structures. The mastoids are clear.  Minor paranasal sinus mucosal thickening greater on the left. Visualized orbit soft tissues are within normal limits.  Negative scalp soft tissues.  IMPRESSION: 1. Normal noncontrast MRI appearance of the brain. 2.  Mild paranasal sinus inflammatory changes.   Original Report Authenticated By: Harley Hallmark, M.D.    Mr Abdomen W Wo Contrast  12/30/2011  *RADIOLOGY REPORT*  Clinical Data: Follow up indeterminate liver mass.  MRI ABDOMEN WITH AND WITHOUT CONTRAST  Technique:  Multiplanar multisequence MR imaging of the abdomen was performed both before and after administration of intravenous  contrast.  Contrast:  10 ml Eovist  Comparison: 01/28/2012 and 02/11/2011  Findings: The subcapsular mass in segment 3 of the left hepatic lobe remains stable in size, measuring 1.7 cm in maximum diameter. This shows T2 hypointensity, arterial phase enhancement, and central scar with retention of Eovist contrast on delayed imaging, consistent with benign focal nodular hyperplasia.  No other liver masses are identified.  Prior cholecystectomy noted. Prominence of bile ducts remains stable, without evidence of biliary obstruction on delayed imaging.  The pancreas, spleen, adrenal glands, and kidneys are normal appearance.  No soft other soft tissue masses or lymphadenopathy identified within the abdomen.  No evidence of inflammatory process or abnormal fluid collections.  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  No further imaging followup is needed.   Original Report Authenticated By: Danae Orleans, M.D.      No diagnosis found.   Date: 12/30/2011  Rate: 82  Rhythm: normal sinus rhythm  QRS Axis: normal  Intervals: normal  ST/T Wave abnormalities: normal  Conduction Disutrbances:none  Narrative Interpretation:   Old EKG Reviewed: unchanged    MDM  The patient  was sent here for low potassium on istat prior to an mri.  She is completely asymptomatic.  Repeat labs reveal a potassium of 3.0 on the bmp.  She was given iv and oral replacement and will be discharged with oral potassium supplement.  She had been started on HCTZ one month ago for HTN.  I suspect this is the cause.  To return prn if she worsens.      I personally performed the services described in this documentation, which was scribed in my presence. The recorded information has been reviewed and considered.         Geoffery Lyons, MD 12/30/11 1610  Geoffery Lyons, MD 12/30/11 1537

## 2011-12-30 NOTE — ED Notes (Addendum)
Pt came to xray for an MRI, did a I stat and k was 2.7.  Dr Darrick Penna office called and wanted her checked.  Pt had an MRI of brain and liver.  To eval liver lesion and recent dizziness.

## 2011-12-30 NOTE — Progress Notes (Signed)
Venipuncture performed in right antecubital for Creatnine level

## 2011-12-30 NOTE — Progress Notes (Signed)
Quick Note:  Notified patient, I-STAT potassium was 2.7. Discussed with Dr. Darrick Penna. She has recommended patient to go to ED for evaluation. Patient agrees to go to ED. Notified ED triage nurse.   FYI, patient recently having hypertension, propranolol doubled. PCP added fluid pill but she is not sure of the name but may be HCTZ. ______

## 2012-01-11 NOTE — Progress Notes (Signed)
Quick Note:  Please let patient know, MRI showed stable benign liver lesion. Per radiologist, no further imaging followup needed. I will also address with RMR. ______

## 2012-01-13 ENCOUNTER — Ambulatory Visit: Payer: BC Managed Care – PPO | Admitting: Internal Medicine

## 2012-01-13 NOTE — Progress Notes (Signed)
Quick Note:  Tried to call pt- LMOM ______ 

## 2012-01-13 NOTE — Progress Notes (Signed)
Quick Note:  Please let patient know, discussed with Dr. Jena Gauss. He recommends repeat MRI liver with EOVIST once more in 18 months. Keep upcoming appt with Dr Jena Gauss for this and other GI issues.   Please NIC for MRI. ______

## 2012-01-18 ENCOUNTER — Other Ambulatory Visit: Payer: Self-pay | Admitting: Urology

## 2012-01-18 ENCOUNTER — Encounter (HOSPITAL_BASED_OUTPATIENT_CLINIC_OR_DEPARTMENT_OTHER): Payer: Self-pay | Admitting: *Deleted

## 2012-01-19 ENCOUNTER — Encounter (HOSPITAL_BASED_OUTPATIENT_CLINIC_OR_DEPARTMENT_OTHER): Payer: Self-pay | Admitting: *Deleted

## 2012-01-19 NOTE — Progress Notes (Signed)
NPO AFTER MN. ARRIVES AT 0745. NEEDS ISTAT. CURRENT EKG IN EPIC AND CHART. WILL TAKE PROPANOLOL AND NEXIUM AM OF SURG W/ SIP OF WATER.

## 2012-01-20 ENCOUNTER — Encounter (HOSPITAL_BASED_OUTPATIENT_CLINIC_OR_DEPARTMENT_OTHER): Payer: Self-pay | Admitting: Anesthesiology

## 2012-01-20 ENCOUNTER — Ambulatory Visit (HOSPITAL_BASED_OUTPATIENT_CLINIC_OR_DEPARTMENT_OTHER): Payer: BC Managed Care – PPO | Admitting: Anesthesiology

## 2012-01-20 ENCOUNTER — Encounter (HOSPITAL_BASED_OUTPATIENT_CLINIC_OR_DEPARTMENT_OTHER): Payer: Self-pay | Admitting: *Deleted

## 2012-01-20 ENCOUNTER — Ambulatory Visit (HOSPITAL_BASED_OUTPATIENT_CLINIC_OR_DEPARTMENT_OTHER)
Admission: RE | Admit: 2012-01-20 | Discharge: 2012-01-20 | Disposition: A | Discharge status: Home / Self Care | Payer: BC Managed Care – PPO | Source: Ambulatory Visit | Attending: Urology | Admitting: Urology

## 2012-01-20 ENCOUNTER — Encounter (HOSPITAL_BASED_OUTPATIENT_CLINIC_OR_DEPARTMENT_OTHER)
Admission: RE | Disposition: A | Discharge status: Home / Self Care | Payer: Self-pay | Source: Ambulatory Visit | Attending: Urology

## 2012-01-20 DIAGNOSIS — N3010 Interstitial cystitis (chronic) without hematuria: Secondary | ICD-10-CM | POA: Insufficient documentation

## 2012-01-20 DIAGNOSIS — E119 Type 2 diabetes mellitus without complications: Secondary | ICD-10-CM | POA: Insufficient documentation

## 2012-01-20 DIAGNOSIS — Z90710 Acquired absence of both cervix and uterus: Secondary | ICD-10-CM | POA: Insufficient documentation

## 2012-01-20 DIAGNOSIS — I10 Essential (primary) hypertension: Secondary | ICD-10-CM | POA: Insufficient documentation

## 2012-01-20 DIAGNOSIS — Z79899 Other long term (current) drug therapy: Secondary | ICD-10-CM | POA: Insufficient documentation

## 2012-01-20 HISTORY — DX: Urgency of urination: R39.15

## 2012-01-20 HISTORY — DX: Other cervical disc degeneration, unspecified cervical region: M50.30

## 2012-01-20 HISTORY — DX: Other specified diseases of liver: K76.89

## 2012-01-20 HISTORY — DX: Frequency of micturition: R35.0

## 2012-01-20 HISTORY — DX: Cervicalgia: M54.2

## 2012-01-20 HISTORY — DX: Other symptoms and signs involving the musculoskeletal system: R29.898

## 2012-01-20 HISTORY — PX: CYSTO WITH HYDRODISTENSION: SHX5453

## 2012-01-20 HISTORY — DX: Personal history of other (healed) physical injury and trauma: Z87.828

## 2012-01-20 HISTORY — DX: Other chronic pain: G89.29

## 2012-01-20 HISTORY — DX: Nocturia: R35.1

## 2012-01-20 HISTORY — DX: Panic disorder (episodic paroxysmal anxiety): F41.0

## 2012-01-20 LAB — POCT I-STAT 4, (NA,K, GLUC, HGB,HCT)
Glucose, Bld: 105 mg/dL — ABNORMAL HIGH (ref 70–99)
HCT: 36 % (ref 36.0–46.0)
Potassium: 2.8 mEq/L — ABNORMAL LOW (ref 3.5–5.1)

## 2012-01-20 SURGERY — CYSTOSCOPY, WITH BLADDER HYDRODISTENSION
Anesthesia: General | Site: Bladder | Wound class: Clean Contaminated

## 2012-01-20 MED ORDER — ONDANSETRON HCL 4 MG/2ML IJ SOLN
INTRAMUSCULAR | Status: DC | PRN
  Administered 2012-01-20: 4 mg via INTRAVENOUS

## 2012-01-20 MED ORDER — PHENAZOPYRIDINE HCL 200 MG PO TABS
ORAL | Status: DC | PRN
  Administered 2012-01-20: 09:00:00 via INTRAVESICAL

## 2012-01-20 MED ORDER — KETOROLAC TROMETHAMINE 30 MG/ML IJ SOLN
INTRAMUSCULAR | Status: DC | PRN
  Administered 2012-01-20: 30 mg via INTRAVENOUS

## 2012-01-20 MED ORDER — LACTATED RINGERS IV SOLN
INTRAVENOUS | Status: DC | PRN
  Administered 2012-01-20: 09:00:00 via INTRAVENOUS

## 2012-01-20 MED ORDER — POTASSIUM CHLORIDE ER 10 MEQ PO TBCR: 10.0000 meq | EXTENDED_RELEASE_TABLET | Freq: Two times a day (BID) | ORAL | Status: DC

## 2012-01-20 MED ORDER — PROMETHAZINE HCL 25 MG/ML IJ SOLN: 6.2500 mg | INTRAMUSCULAR | Status: DC | PRN

## 2012-01-20 MED ORDER — CEFAZOLIN SODIUM-DEXTROSE 2-3 GM-% IV SOLR: 2.0000 g | INTRAVENOUS | Status: DC

## 2012-01-20 MED ORDER — LIDOCAINE HCL (CARDIAC) 20 MG/ML IV SOLN
INTRAVENOUS | Status: DC | PRN
  Administered 2012-01-20: 100 mg via INTRAVENOUS

## 2012-01-20 MED ORDER — LACTATED RINGERS IV SOLN
INTRAVENOUS | Status: DC
  Administered 2012-01-20: 100 mL/h via INTRAVENOUS
  Administered 2012-01-20: 11:00:00 via INTRAVENOUS

## 2012-01-20 MED ORDER — DEXAMETHASONE SODIUM PHOSPHATE 4 MG/ML IJ SOLN
INTRAMUSCULAR | Status: DC | PRN
  Administered 2012-01-20: 4 mg via INTRAVENOUS

## 2012-01-20 MED ORDER — URELLE 81 MG PO TABS
1.0000 | ORAL_TABLET | Freq: Four times a day (QID) | ORAL | Status: DC
  Administered 2012-01-20: 81 mg via ORAL

## 2012-01-20 MED ORDER — FENTANYL CITRATE 0.05 MG/ML IJ SOLN
INTRAMUSCULAR | Status: DC | PRN
  Administered 2012-01-20 (×3): 50 ug via INTRAVENOUS

## 2012-01-20 MED ORDER — LACTATED RINGERS IV SOLN: INTRAVENOUS | Status: DC

## 2012-01-20 MED ORDER — CEFAZOLIN SODIUM 1-5 GM-% IV SOLN: 1.0000 g | INTRAVENOUS | Status: DC

## 2012-01-20 MED ORDER — PROPOFOL 10 MG/ML IV BOLUS
INTRAVENOUS | Status: DC | PRN
  Administered 2012-01-20: 200 mg via INTRAVENOUS

## 2012-01-20 MED ORDER — CEFAZOLIN SODIUM-DEXTROSE 2-3 GM-% IV SOLR
INTRAVENOUS | Status: DC | PRN
  Administered 2012-01-20: 2 g via INTRAVENOUS

## 2012-01-20 MED ORDER — FENTANYL CITRATE 0.05 MG/ML IJ SOLN: 25.0000 ug | INTRAMUSCULAR | Status: DC | PRN

## 2012-01-20 MED ORDER — STERILE WATER FOR IRRIGATION IR SOLN
Status: DC | PRN
  Administered 2012-01-20: 3000 mL

## 2012-01-20 MED ORDER — MIDAZOLAM HCL 5 MG/5ML IJ SOLN
INTRAMUSCULAR | Status: DC | PRN
  Administered 2012-01-20: 2 mg via INTRAVENOUS

## 2012-01-20 SURGICAL SUPPLY — 19 items
BAG DRAIN URO-CYSTO SKYTR STRL (DRAIN) ×2 IMPLANT
BAG DRN UROCATH (DRAIN) ×1
CANISTER SUCT LVC 12 LTR MEDI- (MISCELLANEOUS) ×1 IMPLANT
CATH ROBINSON RED A/P 16FR (CATHETERS) ×1 IMPLANT
CLOTH BEACON ORANGE TIMEOUT ST (SAFETY) ×2 IMPLANT
DRAPE CAMERA CLOSED 9X96 (DRAPES) ×2 IMPLANT
ELECT REM PT RETURN 9FT ADLT (ELECTROSURGICAL) ×2
ELECTRODE REM PT RTRN 9FT ADLT (ELECTROSURGICAL) ×1 IMPLANT
GLOVE BIO SURGEON STRL SZ 6 (GLOVE) ×1 IMPLANT
GLOVE BIO SURGEON STRL SZ7 (GLOVE) ×2 IMPLANT
GLOVE ECLIPSE 6.5 STRL STRAW (GLOVE) ×1 IMPLANT
GOWN XL W/COTTON TOWEL STD (GOWNS) ×2 IMPLANT
NDL SAFETY ECLIPSE 18X1.5 (NEEDLE) IMPLANT
NEEDLE HYPO 18GX1.5 SHARP (NEEDLE) ×2
NEEDLE HYPO 22GX1.5 SAFETY (NEEDLE) IMPLANT
NS IRRIG 500ML POUR BTL (IV SOLUTION) IMPLANT
PACK CYSTOSCOPY (CUSTOM PROCEDURE TRAY) ×2 IMPLANT
SYR 20CC LL (SYRINGE) ×1 IMPLANT
WATER STERILE IRR 3000ML UROMA (IV SOLUTION) ×2 IMPLANT

## 2012-01-20 NOTE — Anesthesia Preprocedure Evaluation (Addendum)
Anesthesia Evaluation  Patient identified by MRN, date of birth, ID band Patient awake    Reviewed: Allergy & Precautions, H&P , NPO status , Patient's Chart, lab work & pertinent test results, reviewed documented beta blocker date and time   History of Anesthesia Complications (+) PONV  Airway Mallampati: I TM Distance: >3 FB Neck ROM: limited    Dental  (+) Teeth Intact   Pulmonary neg pulmonary ROS,  breath sounds clear to auscultation        Cardiovascular hypertension, On Home Beta Blockers Rhythm:regular Rate:Normal     Neuro/Psych  Headaches, PSYCHIATRIC DISORDERS (depression, anxiety) Anxiety Depression Chronic neck pain and hand/arm paresthesias and numbness (on percocet)    GI/Hepatic Neg liver ROS, GERD-  Medicated and Controlled,  Endo/Other  negative endocrine ROS  Renal/GU Renal diseasenegative Renal ROS     Musculoskeletal   Abdominal   Peds  Hematology negative hematology ROS (+)   Anesthesia Other Findings Interstitial cystitis irritable bowel  Reproductive/Obstetrics                           Anesthesia Physical Anesthesia Plan  ASA: II  Anesthesia Plan: General   Post-op Pain Management:    Induction: Intravenous  Airway Management Planned: LMA  Additional Equipment:   Intra-op Plan:   Post-operative Plan: Extubation in OR  Informed Consent: I have reviewed the patients History and Physical, chart, labs and discussed the procedure including the risks, benefits and alternatives for the proposed anesthesia with the patient or authorized representative who has indicated his/her understanding and acceptance.   Dental advisory given  Plan Discussed with: CRNA  Anesthesia Plan Comments:         Anesthesia Quick Evaluation

## 2012-01-20 NOTE — Transfer of Care (Signed)
Immediate Anesthesia Transfer of Care Note  Patient: Colleen Duncan  Procedure(s) Performed: Procedure(s) (LRB): CYSTOSCOPY/HYDRODISTENSION (N/A)  Patient Location: PACU  Anesthesia Type: General  Level of Consciousness: awake, sedated, patient cooperative and responds to stimulation  Airway & Oxygen Therapy: Patient Spontanous Breathing and Patient connected to [place] O2  Post-op Assessment: Report given to PACU RN, Post -op Vital signs reviewed and stable and Patient moving all extremities  Post vital signs: Reviewed and stable  Complications: No apparent anesthesia complications

## 2012-01-20 NOTE — Anesthesia Procedure Notes (Signed)
Procedure Name: LMA Insertion Date/Time: 01/20/2012 9:18 AM Performed by: Jessica Priest Pre-anesthesia Checklist: Patient identified, Emergency Drugs available, Suction available and Patient being monitored Patient Re-evaluated:Patient Re-evaluated prior to inductionOxygen Delivery Method: Circle System Utilized Preoxygenation: Pre-oxygenation with 100% oxygen Intubation Type: IV induction Ventilation: Mask ventilation without difficulty LMA: LMA inserted LMA Size: 4.0 Number of attempts: 1 Airway Equipment and Method: bite block Placement Confirmation: positive ETCO2 Tube secured with: Tape Dental Injury: Teeth and Oropharynx as per pre-operative assessment

## 2012-01-20 NOTE — Op Note (Signed)
Colleen Duncan is a 41 y.o.   01/20/2012  General  Pre-op diagnosis: Pelvic pain, interstitial cystitis  Postop diagnosis: Same  Procedure done: Cystoscopy, HOD, bladder instillation of Pyridium and Marcaine  Surgeon: Wendie Simmer. [place]  Anesthesia: General  Indication: Patient is a 41 years old female who has a history of interstitial cystitis. She has been having pelvic pain for the past several weeks. She had pain relief when she had her last hydrodistention. Medications are not helping her pain. She is scheduled today for cystoscopy and hydraulic bladder distention.  Procedure: Patient was identified by her wrist band and proper timeout was taken.  Under general anesthesia she was prepped and draped and placed in the dorsolithotomy position. A panendoscope was inserted in the bladder. The bladder mucosa is reddened. There is no stone or tumor in the bladder. There was evidence of submucosal hemorrhage. The bladder was then distended for about 10 minutes. The bladder capacity is about 500 cc. The cystoscope was removed. A #16 French red Robinson catheter was inserted in the bladder and 400 mg of Pyridium in 20 cc of 0.5% Marcaine were instilled in the bladder. The catheter was then removed.  The patient tolerated the procedure well and left the OR in satisfactory condition to postanesthesia care unit.

## 2012-01-20 NOTE — H&P (Signed)
History of Present Illness  Ms Colleen Duncan has intertstitial cystitis.  She has been complaining of suprapubic and vaginal pain.  The pain is worse after urination.  She has not had any relief from bladder instillations.  urinalysis shows 0-2 RBC's, 0-2 WBC's.  She is on Elmiron and Uribel.  She had marked improvement after HOD in October 2011.   Past Medical History Problems  1. History of  Anxiety (Symptom) 300.00 2. History of  Arthritis V13.4 3. History of  Bladder Irrigation 4. History of  Diabetes Mellitus 250.00 5. History of  Gout 274.00 6. History of  Heartburn 787.1 7. History of  Hypertension 401.9 8. History of  Murmurs 785.2  Surgical History Problems  1. History of  Cholecystectomy 2. History of  Cystoscopy With Dilation Of Bladder 3. History of  Hysterectomy V45.77 4. History of  Knee Surgery 5. History of  Neck Surgery 6. History of  Surgical Excision Of Tubal Pregnancy 7. History of  Tubal Ligation V25.2  Current Meds 1. ALPRAZolam 0.5 MG Oral Tablet; Therapy: 27Oct2011 to 2. Amitriptyline HCl 10 MG Oral Tablet; TAKE 1 TABLET Bedtime; Therapy: (Recorded:13Sep2013)  to 3. Ativan TABS; Therapy: (Recorded:06Sep2013) to 4. Benadryl TABS; Therapy: (Recorded:06Sep2013) to 5. Elmiron 100 MG Oral Capsule; TAKE ONE CAPSULE TWICE A DAY; Therapy: 15Feb2012 to  (Evaluate:01Sep2014)  Requested for: 06Sep2013; Last Rx:06Sep2013 6. Hydrochlorothiazide 25 MG Oral Tablet; Therapy: 04Sep2013 to 7. Hyoscyamine Sulfate 0.125 MG Sublingual Tablet Sublingual; Therapy: 11Sep2013 to 8. Ibuprofen TABS; Therapy: (Recorded:06Sep2013) to 9. NexIUM 40 MG Oral Capsule Delayed Release; Therapy: (Recorded:23Mar2011) to 10. Oxycodone-Acetaminophen 5-325 MG Oral Tablet; Therapy: (Recorded:06Sep2013) to 11. Potassium Chloride Crys ER 10 MEQ Oral Tablet Extended Release; Therapy: 20Sep2013 to 12. Promethazine HCl 25 MG Oral Tablet; May take 0.5 to 1 tablet Q 6hrs nausea/vomiting; Therapy:   06Sep2013  to (Last Rx:06Sep2013)  Requested for: 06Sep2013 13. Propranolol HCl 20 MG Oral Tablet; Therapy: 29Dec2010 to 14. Uribel 118 MG Oral Capsule; TAKE 1 CAPSULE 3 times daily PRN bladder pain; Therapy:   15Feb2012 to (Last Rx:06Sep2013)  Requested for: 06Sep2013  Allergies Medication  1. Morphine Derivatives  Family History Problems  1. Paternal history of  Blood In Urine 2. Maternal history of  Cancer 3. Maternal history of  Diabetes Mellitus V18.0 4. Maternal grandmother's history of  Diabetes Mellitus V18.0 5. Paternal grandmother's history of  Diabetes Mellitus V18.0 6. Family history of  Family Health Status - Father's Age 59y/o 7. Family history of  Family Health Status - Mother's Age 56y/o 8. Family history of  Family Health Status Number Of Children 2 sons 75. Paternal history of  Hypertension V17.49 10. Sororal history of  Hypertension V17.49 11. Maternal history of  Hypertension V17.49 12. Paternal history of  Nephrolithiasis 13. Sororal history of  Nephrolithiasis 14. Maternal grandmother's history of  Pancreatic Neoplasm  Social History Problems  1. Alcohol Use once yearly 2. Caffeine Use 2 qd 3. Marital History - Currently Married 4. Never A Smoker 5. Occupation: Copy  Review of Systems Genitourinary, constitutional, skin, eye, otolaryngeal, hematologic/lymphatic, cardiovascular, pulmonary, endocrine, musculoskeletal, gastrointestinal, neurological and psychiatric system(s) were reviewed and pertinent findings if present are noted.  Genitourinary: urinary frequency, urinary urgency, dysuria and suprapubic pain.    Vitals Vital Signs [Data Includes: Last 1 Day]  08Oct2013 03:11PM  Blood Pressure: 146 / 97 Temperature: 98.4 F Heart Rate: 96 Respiration: 18  Physical Exam Constitutional: Well nourished and well developed . No acute distress.  ENT:. The  ears and nose are normal in appearance.  Neck: The appearance of the neck is normal and  no neck mass is present.  Pulmonary: No respiratory distress and normal respiratory rhythm and effort.  Cardiovascular: Heart rate and rhythm are normal . No peripheral edema.  Abdomen: The abdomen is soft and nontender. No masses are palpated. No CVA tenderness. No hernias are palpable. No hepatosplenomegaly noted.  Genitourinary:  Chaperone Present: .  Examination of the external genitalia shows normal female external genitalia and no lesions. The urethra is normal in appearance and not tender. There is no urethral mass. Vaginal exam demonstrates no abnormalities. The adnexa are palpably normal. The bladder is non tender and not distended. The anus is normal on inspection. The perineum is normal on inspection.  Lymphatics: The femoral and inguinal nodes are not enlarged or tender.  Skin: Normal skin turgor, no visible rash and no visible skin lesions.  Neuro/Psych:. Mood and affect are appropriate.    Results/Data Urine [Data Includes: Last 1 Day]   08Oct2013  COLOR GREEN   APPEARANCE CLEAR   SPECIFIC GRAVITY 1.020   pH 6.5   GLUCOSE NEG mg/dL  BILIRUBIN SMALL   KETONE TRACE mg/dL  BLOOD TRACE   PROTEIN NEG mg/dL  UROBILINOGEN 0.2 mg/dL  NITRITE NEG   LEUKOCYTE ESTERASE NEG   SQUAMOUS EPITHELIAL/HPF FEW   WBC 0-2 WBC/hpf  RBC 0-2 RBC/hpf  BACTERIA NONE SEEN   CRYSTALS NONE SEEN   CASTS NONE SEEN    Assessment Assessed  1. Chronic Interstitial Cystitis 595.1  Plan Chronic Interstitial Cystitis (595.1)  1. Follow-up Schedule Surgery Office  Follow-up  Done: 08Oct2013   Cysto HOD.  The procedure, risks, benefits were reviewed with the patient.  She understands and wishes to proceed.   Signatures Electronically signed by : Su Grand, M.D.; Jan 17 2012  4:05PM

## 2012-01-20 NOTE — Anesthesia Postprocedure Evaluation (Signed)
Anesthesia Post Note  Patient: Colleen Duncan  Procedure(s) Performed: Procedure(s) (LRB): CYSTOSCOPY/HYDRODISTENSION (N/A)  Anesthesia type: General  Patient location: PACU  Post pain: Pain level controlled  Post assessment: Post-op Vital signs reviewed  Last Vitals:  Filed Vitals:   01/20/12 1015  BP:   Pulse: 76  Temp:   Resp: 12    Post vital signs: Reviewed  Level of consciousness: sedated  Complications: No apparent anesthesia complications

## 2012-01-23 ENCOUNTER — Encounter (HOSPITAL_BASED_OUTPATIENT_CLINIC_OR_DEPARTMENT_OTHER): Payer: Self-pay | Admitting: Urology

## 2012-01-24 ENCOUNTER — Ambulatory Visit: Payer: BC Managed Care – PPO | Admitting: Internal Medicine

## 2012-02-23 ENCOUNTER — Other Ambulatory Visit (HOSPITAL_COMMUNITY): Payer: Self-pay | Admitting: *Deleted

## 2012-02-23 DIAGNOSIS — I10 Essential (primary) hypertension: Secondary | ICD-10-CM

## 2012-03-02 ENCOUNTER — Ambulatory Visit: Payer: BC Managed Care – PPO | Admitting: Internal Medicine

## 2012-03-13 ENCOUNTER — Encounter (HOSPITAL_COMMUNITY): Payer: BC Managed Care – PPO

## 2012-03-16 ENCOUNTER — Encounter (HOSPITAL_COMMUNITY): Payer: BC Managed Care – PPO

## 2012-03-18 HISTORY — PX: OTHER SURGICAL HISTORY: SHX169

## 2012-04-11 HISTORY — PX: OTHER SURGICAL HISTORY: SHX169

## 2012-04-17 ENCOUNTER — Ambulatory Visit (HOSPITAL_COMMUNITY)
Admission: RE | Admit: 2012-04-17 | Discharge: 2012-04-17 | Disposition: A | Discharge status: Home / Self Care | Payer: BC Managed Care – PPO | Source: Ambulatory Visit | Attending: Cardiovascular Disease | Admitting: Cardiovascular Disease

## 2012-04-17 DIAGNOSIS — I10 Essential (primary) hypertension: Secondary | ICD-10-CM | POA: Insufficient documentation

## 2012-04-17 HISTORY — PX: OTHER SURGICAL HISTORY: SHX169

## 2012-04-17 NOTE — Progress Notes (Signed)
Renal duplex completed. Drayven Marchena D  

## 2012-04-23 ENCOUNTER — Other Ambulatory Visit: Payer: Self-pay | Admitting: Anesthesiology

## 2012-04-23 DIAGNOSIS — M542 Cervicalgia: Secondary | ICD-10-CM

## 2012-04-24 ENCOUNTER — Ambulatory Visit (INDEPENDENT_AMBULATORY_CARE_PROVIDER_SITE_OTHER): Payer: BC Managed Care – PPO | Admitting: Internal Medicine

## 2012-04-24 ENCOUNTER — Encounter: Payer: Self-pay | Admitting: Internal Medicine

## 2012-04-24 VITALS — BP 136/84 | HR 86 | Temp 98.2°F | Ht 66.0 in | Wt 140.4 lb

## 2012-04-24 DIAGNOSIS — R1310 Dysphagia, unspecified: Secondary | ICD-10-CM

## 2012-04-24 DIAGNOSIS — K219 Gastro-esophageal reflux disease without esophagitis: Secondary | ICD-10-CM

## 2012-04-24 DIAGNOSIS — R634 Abnormal weight loss: Secondary | ICD-10-CM

## 2012-04-24 NOTE — Patient Instructions (Addendum)
Schedule EGD with dilation for dysphagia  Continue Nexiium 40 mg daily  GERD information  Phenergan 25 mg prior to procedure (short stay)

## 2012-04-24 NOTE — Progress Notes (Signed)
.  rmr 

## 2012-04-24 NOTE — Progress Notes (Signed)
Primary Care Physician:  Cassell Smiles., MD Primary Gastroenterologist:  Dr. Jena Gauss  Pre-Procedure History & Physical: HPI:  Colleen Duncan is a 42 y.o. female here for followup of GERD irritable bowel syndrome.  History of EGD with Maloney dilation in  2008  -  History Schatzki's ring. Patient tells me she gets nauseated  - has difficulty with swallowing solid food with recurrent dysphagia symptoms over the past several months. She is down 30 pounds. Followed by [facility]  cardiology and [facility]. Her potassium has fluctuated quite a bit with her diuretic medication. Again, reflux symptoms well controlled on Nexium 40 mg once daily. She denies any significant abdominal pain. No melena or hematochezia. 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 She's also had problems with interstitial cystitis recently.  Chronic intermittent nausea treated with oral Phenergan. 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 This is been stable of her period of years last MRI done in October of last year confirm stability and the radiologist recommended no further imaging for this finding. Her LFTs have been normal. Her AFP was normal previously as well.  Past Medical History  Diagnosis Date  . PONV (postoperative nausea and vomiting)   . Hypertension     on inderal-instructed to take dos  . Seasonal allergies   . GERD (gastroesophageal reflux disease)     nexium-instructed to take dos  . Depression   . Irritable bowel   . Interstitial cystitis   . Migraine     migraines-treated with inderal along with treatment for htn  . Chronic neck pain   . DDD (degenerative disc disease), cervical   . Liver nodule STABLE BENIGN FOCAL HYPERPLASIA NODULAR LEFT HEPATIC LOBE    PER MRI  01-17-2012  . History of head injury 2012-- LOC BUT NO RESIDUALS (PT RACES GO-CARTS)  . Frequency of urination   . Urgency of urination   . Nocturia   . Right arm  weakness SECONARDAY TO CERVICAL FUSION  . Anxiety   . Anxiety attack   . Hemorrhoids     Past Surgical History  Procedure Date  . Knee arthroscopy 1992  &  1988  . Ectopic pregnancy surgery 1993  . Tonsillectomy   . Laparoscopy 01/04/2011    Procedure: LAPAROSCOPY DIAGNOSTIC;  Surgeon: Melony Overly;  Location: [facility];  Service: Gynecology;  Laterality: N/A;  Bilateral salpingoectomy/ LYSIS ADHESIONS  . Cysto/  hod 01-26-2010 ;  09-01-2003 ;   DR NESI    CHRONIC I.C.  . Anterior cervical decomp/discectomy fusion 04-25-2005    C6 - 7  . Right ureteroscopic stone extraction 05-31-2002  . Laparoscopic cholecystectomy 11-02-2001  . Vaginal hysterectomy 07-05-2001  . Cysto with hydrodistension 01/20/2012    Procedure: CYSTOSCOPY/HYDRODISTENSION;  Surgeon: Lindaann Slough, MD;  Location: [facility];  Service: Urology;  Laterality: N/A;  Bladder instillation of Marcaine and Pyridium   . Esophagogastroduodenoscopy 02/15/2007    Dr. Jena Gauss- noncritical subtle schatzki's ring, o/w normal. tiny hiatal hernia o/w normal stomach  . Esophagogastroduodenoscopy 06/18/2007    Dr. Elmer Ramp esophagus, stomach, D1 and D2  . Bravo ph study 06/18/2007    Dr. Jena Gauss- study indicates extremely good control of gastroesophageal reflux while on acid supppression therapy.    Prior to Admission medications   Medication Sig Start Date End Date Taking? Authorizing Provider  ALPRAZolam Prudy Feeler) 0.5 MG tablet Take 0.5 mg by mouth 3 (three) times daily as needed. For anxiety.  Yes Historical Provider, MD  amitriptyline (ELAVIL) 10 MG tablet Take 10 mg by mouth at bedtime.   Yes Historical Provider, MD  amLODipine (NORVASC) 2.5 MG tablet Take 2.5 mg by mouth daily.  04/06/12  Yes Historical Provider, MD  Aspirin-Acetaminophen-Caffeine (GOODY HEADACHE PO) Take 1 packet by mouth as needed.    Yes Historical Provider, MD  diphenhydrAMINE (BENADRYL) 25 mg capsule Take 25 mg by mouth at bedtime as needed.  For allergies   Yes Historical Provider, MD  esomeprazole (NEXIUM) 40 MG capsule Take 40 mg by mouth daily before breakfast.    Yes Historical Provider, MD  fexofenadine (ALLEGRA) 180 MG tablet Take 180 mg by mouth daily.   Yes Historical Provider, MD  hyoscyamine (LEVSIN SL) 0.125 MG SL tablet Place 0.125 mg under the tongue every 4 (four) hours as needed.   Yes Historical Provider, MD  ibuprofen (ADVIL,MOTRIN) 200 MG tablet Take 400-600 mg by mouth every 8 (eight) hours as needed. For pain.   Yes Historical Provider, MD  Meth-Hyo-M Bl-Na Phos-Ph Sal (URIBEL) 118 MG CAPS Take 1 capsule by mouth 3 (three) times daily.   Yes Historical Provider, MD  oxyCODONE-acetaminophen (PERCOCET) 5-325 MG per tablet Take 1 tablet by mouth every 4 (four) hours as needed. For pain.   Yes Historical Provider, MD  pentosan polysulfate (ELMIRON) 100 MG capsule Take 100 mg by mouth 2 (two) times daily.    Yes Historical Provider, MD  polyethylene glycol (MIRALAX / GLYCOLAX) packet Take 17 g by mouth daily as needed. For constipation.   Yes Historical Provider, MD  potassium chloride (K-DUR) 10 MEQ tablet Take 1 tablet (10 mEq total) by mouth 2 (two) times daily. 01/20/12  Yes Marc-Henry Nesi, MD  promethazine (PHENERGAN) 25 MG tablet Take 25 mg by mouth every 6 (six) hours as needed. For nausea   Yes Historical Provider, MD  propranolol (INDERAL) 20 MG tablet Take 20 mg by mouth 2 (two) times daily.    Yes Historical Provider, MD  HYDROCHLOROTHIAZIDE PO Take 1 tablet by mouth daily.    Historical Provider, MD  potassium chloride (K-DUR) 10 MEQ tablet Take 1 tablet (10 mEq total) by mouth daily. 12/30/11   Geoffery Lyons, MD    Allergies as of 04/24/2012 - Review Complete 04/24/2012  Allergen Reaction Noted  . Morphine Rash     Family History  Problem Relation Age of Onset  . Pancreatic cancer Maternal Grandmother   . Diabetes Mother   . Heart disease Father   . Diabetes Sister     History   Social History    . Marital Status: Married    Spouse Name: N/A    Number of Children: 2  . 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 Smoking status: Never Smoker   . Smokeless tobacco: Never Used  . Alcohol Use: Yes     Comment: OCCASIONAL  . Drug Use: No  . Sexually Active: Not on file   Other Topics Concern  . Not on file   Social History Narrative  . No narrative on file    Review of Systems: See HPI, otherwise negative ROS  Physical Exam: BP 136/84  Pulse 86  Temp 98.2 F (36.8 C) (Oral)  Ht 5\' 6"  (1.676 m)  Wt 140 lb 6.4 oz (63.685 kg)  BMI 22.66 kg/m2 General:   Alert,  Well-developed, well-nourished, pleasant and cooperative in NAD Skin:  Intact without  significant lesions or rashes.  She is significantly tanned (has a tanning bed at home a ) Eyes:  Sclera clear, no icterus.   Conjunctiva pink. Ears:  Normal auditory acuity. Nose:  No deformity, discharge,  or lesions. Mouth:  No deformity or lesions. Neck:  Supple; no masses or thyromegaly. No significant cervical adenopathy. Lungs:  Clear throughout to auscultation.   No wheezes, crackles, or rhonchi. No acute distress. Heart:  Regular rate and rhythm; no murmurs, clicks, rubs,  or gallops. Abdomen: Non-distended, normal bowel sounds.  Soft and nontender without appreciable mass or hepatosplenomegaly.  Pulses:  Normal pulses noted. Extremities:  Without clubbing or edema.  Impression/Plan:  Long-standing GERD well-controlled on Nexium. Now with recurrent esophageal dysphagia to solid food dysphagia known Schatzki's ring. Prior esophageal dilation helped significantly with her dysphagia symptoms. Chronic constipation well managed with MiraLax. She lost a significant amount of weight over the past year. At least, in part, related to her dysphagia symptoms. Weight loss otherwise non-specific but impresses. She tells me that she's had a battery of labs done through [place] to  cardiology in [facility]. She reports her thyroid is normal.  She had colonoscopy some 11 years ago spent have only hemorrhoids at that time. Clinically, no bleeding. We need to check stool for occult blood. I would like to see copies of most recent lab work.  Recommendations:  I have offered patient an EGD with esophageal dilation as appropriate to further evaluate/intervene with her recent symptoms.The risks, benefits, limitations, alternatives and imponderables have been reviewed with the patient. Potential for esophageal dilation, biopsy, etc. have also been reviewed.  Will consider biopsies at time of EGD to evaluate further for celiac disease, eosinophilic gastroenteritis, etc. Questions have been answered. All parties agreeable.  Will also have patient collect a stool sample for occult blood testing. Will also review recent labs done elsewhere when they become available. Plan to premedicate with Phenergan on the day of the procedure.   Addendum: Hemoglobin and hematocrit 11.9 and 36.0 just 3 weeks ago. Potassium was low at 2.7 and 3.02 weeks ago.  Patient to followup with her other physicians regarding hypokalemia.

## 2012-04-25 ENCOUNTER — Encounter (HOSPITAL_COMMUNITY): Payer: Self-pay | Admitting: Pharmacy Technician

## 2012-04-26 ENCOUNTER — Encounter (HOSPITAL_COMMUNITY): Payer: Self-pay | Admitting: Pharmacy Technician

## 2012-04-28 ENCOUNTER — Other Ambulatory Visit: Payer: BC Managed Care – PPO

## 2012-05-07 ENCOUNTER — Encounter (HOSPITAL_COMMUNITY): Payer: Self-pay | Admitting: *Deleted

## 2012-05-07 ENCOUNTER — Ambulatory Visit (HOSPITAL_COMMUNITY)
Admission: RE | Admit: 2012-05-07 | Discharge: 2012-05-07 | Disposition: A | Discharge status: Home / Self Care | Payer: BC Managed Care – PPO | Source: Ambulatory Visit | Attending: Internal Medicine | Admitting: Internal Medicine

## 2012-05-07 ENCOUNTER — Encounter (HOSPITAL_COMMUNITY)
Admission: RE | Disposition: A | Discharge status: Home / Self Care | Payer: Self-pay | Source: Ambulatory Visit | Attending: Internal Medicine

## 2012-05-07 DIAGNOSIS — K222 Esophageal obstruction: Secondary | ICD-10-CM | POA: Insufficient documentation

## 2012-05-07 DIAGNOSIS — K449 Diaphragmatic hernia without obstruction or gangrene: Secondary | ICD-10-CM

## 2012-05-07 DIAGNOSIS — K219 Gastro-esophageal reflux disease without esophagitis: Secondary | ICD-10-CM

## 2012-05-07 DIAGNOSIS — R1310 Dysphagia, unspecified: Secondary | ICD-10-CM

## 2012-05-07 DIAGNOSIS — I10 Essential (primary) hypertension: Secondary | ICD-10-CM | POA: Insufficient documentation

## 2012-05-07 DIAGNOSIS — R634 Abnormal weight loss: Secondary | ICD-10-CM | POA: Insufficient documentation

## 2012-05-07 DIAGNOSIS — R933 Abnormal findings on diagnostic imaging of other parts of digestive tract: Secondary | ICD-10-CM

## 2012-05-07 HISTORY — PX: ESOPHAGOGASTRODUODENOSCOPY (EGD) WITH ESOPHAGEAL DILATION: SHX5812

## 2012-05-07 SURGERY — ESOPHAGOGASTRODUODENOSCOPY (EGD) WITH ESOPHAGEAL DILATION
Anesthesia: Moderate Sedation

## 2012-05-07 MED ORDER — MIDAZOLAM HCL 5 MG/5ML IJ SOLN
INTRAMUSCULAR | Status: AC
  Filled 2012-05-07: qty 10

## 2012-05-07 MED ORDER — STERILE WATER FOR IRRIGATION IR SOLN
Status: DC | PRN
  Administered 2012-05-07: 10:00:00

## 2012-05-07 MED ORDER — MEPERIDINE HCL 100 MG/ML IJ SOLN
INTRAMUSCULAR | Status: AC
  Filled 2012-05-07: qty 2

## 2012-05-07 MED ORDER — MIDAZOLAM HCL 5 MG/5ML IJ SOLN
INTRAMUSCULAR | Status: DC | PRN
  Administered 2012-05-07 (×4): 1 mg via INTRAVENOUS
  Administered 2012-05-07: 2 mg via INTRAVENOUS
  Administered 2012-05-07: 1 mg via INTRAVENOUS
  Administered 2012-05-07: 2 mg via INTRAVENOUS

## 2012-05-07 MED ORDER — MEPERIDINE HCL 100 MG/ML IJ SOLN
INTRAMUSCULAR | Status: DC | PRN
  Administered 2012-05-07: 25 mg via INTRAVENOUS
  Administered 2012-05-07 (×2): 50 mg via INTRAVENOUS

## 2012-05-07 MED ORDER — PROMETHAZINE HCL 25 MG/ML IJ SOLN
25.0000 mg | Freq: Once | INTRAMUSCULAR | Status: AC
  Administered 2012-05-07: 25 mg via INTRAVENOUS

## 2012-05-07 MED ORDER — SODIUM CHLORIDE 0.45 % IV SOLN
INTRAVENOUS | Status: DC
  Administered 2012-05-07: 09:00:00 via INTRAVENOUS

## 2012-05-07 MED ORDER — BUTAMBEN-TETRACAINE-BENZOCAINE 2-2-14 % EX AERO
INHALATION_SPRAY | CUTANEOUS | Status: DC | PRN
  Administered 2012-05-07: 2 via TOPICAL

## 2012-05-07 MED ORDER — PROMETHAZINE HCL 25 MG/ML IJ SOLN
INTRAMUSCULAR | Status: AC
  Filled 2012-05-07: qty 1

## 2012-05-07 MED ORDER — ONDANSETRON HCL 4 MG/2ML IJ SOLN
INTRAMUSCULAR | Status: DC | PRN
  Administered 2012-05-07: 4 mg via INTRAVENOUS

## 2012-05-07 MED ORDER — ONDANSETRON HCL 4 MG/2ML IJ SOLN
INTRAMUSCULAR | Status: AC
  Administered 2012-05-07: 4 mg
  Filled 2012-05-07: qty 2

## 2012-05-07 MED ORDER — SODIUM CHLORIDE 0.9 % IJ SOLN
INTRAMUSCULAR | Status: AC
  Filled 2012-05-07: qty 10

## 2012-05-07 NOTE — H&P (View-Only) (Signed)
Primary Care Physician:  FUSCO,LAWRENCE J., MD Primary Gastroenterologist:  Dr. Aysiah Jurado  Pre-Procedure History & Physical: HPI:  Colleen Duncan is a 42 y.o. female here for followup of GERD irritable bowel syndrome.  History of EGD with Maloney dilation in  2008  -  History Schatzki's ring. Patient tells me she gets nauseated  - has difficulty with swallowing solid food with recurrent dysphagia symptoms over the past several months. She is down 30 pounds. Followed by [facility] and [facility]. Her potassium has fluctuated quite a bit with her diuretic medication. Again, reflux symptoms well controlled on Nexium 40 mg once daily. She denies any significant abdominal pain. No melena or hematochezia. 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 She's also had problems with interstitial cystitis recently.  Chronic intermittent nausea treated with oral Phenergan. 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 This is been stable of her period of years last MRI done in October of last year confirm stability and the radiologist recommended no further imaging for this finding. Her LFTs have been normal. Her AFP was normal previously as well.  Past Medical History  Diagnosis Date  . PONV (postoperative nausea and vomiting)   . Hypertension     on inderal-instructed to take dos  . Seasonal allergies   . GERD (gastroesophageal reflux disease)     nexium-instructed to take dos  . Depression   . Irritable bowel   . Interstitial cystitis   . Migraine     migraines-treated with inderal along with treatment for htn  . Chronic neck pain   . DDD (degenerative disc disease), cervical   . Liver nodule STABLE BENIGN FOCAL HYPERPLASIA NODULAR LEFT HEPATIC LOBE    PER MRI  01-17-2012  . History of head injury 2012-- LOC BUT NO RESIDUALS (PT RACES GO-CARTS)  . Frequency of urination   . Urgency of urination   . Nocturia   . Right arm  weakness SECONARDAY TO CERVICAL FUSION  . Anxiety   . Anxiety attack   . Hemorrhoids     Past Surgical History  Procedure Date  . Knee arthroscopy 1992  &  1988  . Ectopic pregnancy surgery 1993  . Tonsillectomy   . Laparoscopy 01/04/2011    Procedure: LAPAROSCOPY DIAGNOSTIC;  Surgeon: Brook A Silva;  Location: [facility];  Service: Gynecology;  Laterality: N/A;  Bilateral salpingoectomy/ LYSIS ADHESIONS  . Cysto/  hod 01-26-2010 ;  09-01-2003 ;   DR NESI    CHRONIC I.C.  . Anterior cervical decomp/discectomy fusion 04-25-2005    C6 - 7  . Right ureteroscopic stone extraction 05-31-2002  . Laparoscopic cholecystectomy 11-02-2001  . Vaginal hysterectomy 07-05-2001  . Cysto with hydrodistension 01/20/2012    Procedure: CYSTOSCOPY/HYDRODISTENSION;  Surgeon: Marc-Henry Nesi, MD;  Location: [facility];  Service: Urology;  Laterality: N/A;  Bladder instillation of Marcaine and Pyridium   . Esophagogastroduodenoscopy 02/15/2007    Dr. Lida Berkery- noncritical subtle schatzki's ring, o/w normal. tiny hiatal hernia o/w normal stomach  . Esophagogastroduodenoscopy 06/18/2007    Dr. Romy Ipock-normal esophagus, stomach, D1 and D2  . Bravo ph study 06/18/2007    Dr. Kaylor Maiers- study indicates extremely good control of gastroesophageal reflux while on acid supppression therapy.    Prior to Admission medications   Medication Sig Start Date End Date Taking? Authorizing Provider  ALPRAZolam (XANAX) 0.5 MG tablet Take 0.5 mg by mouth 3 (three) times daily as needed. For anxiety.     Yes Historical Provider, MD  amitriptyline (ELAVIL) 10 MG tablet Take 10 mg by mouth at bedtime.   Yes Historical Provider, MD  amLODipine (NORVASC) 2.5 MG tablet Take 2.5 mg by mouth daily.  04/06/12  Yes Historical Provider, MD  Aspirin-Acetaminophen-Caffeine (GOODY HEADACHE PO) Take 1 packet by mouth as needed.    Yes Historical Provider, MD  diphenhydrAMINE (BENADRYL) 25 mg capsule Take 25 mg by mouth at bedtime as needed.  For allergies   Yes Historical Provider, MD  esomeprazole (NEXIUM) 40 MG capsule Take 40 mg by mouth daily before breakfast.    Yes Historical Provider, MD  fexofenadine (ALLEGRA) 180 MG tablet Take 180 mg by mouth daily.   Yes Historical Provider, MD  hyoscyamine (LEVSIN SL) 0.125 MG SL tablet Place 0.125 mg under the tongue every 4 (four) hours as needed.   Yes Historical Provider, MD  ibuprofen (ADVIL,MOTRIN) 200 MG tablet Take 400-600 mg by mouth every 8 (eight) hours as needed. For pain.   Yes Historical Provider, MD  Meth-Hyo-M Bl-Na Phos-Ph Sal (URIBEL) 118 MG CAPS Take 1 capsule by mouth 3 (three) times daily.   Yes Historical Provider, MD  oxyCODONE-acetaminophen (PERCOCET) 5-325 MG per tablet Take 1 tablet by mouth every 4 (four) hours as needed. For pain.   Yes Historical Provider, MD  pentosan polysulfate (ELMIRON) 100 MG capsule Take 100 mg by mouth 2 (two) times daily.    Yes Historical Provider, MD  polyethylene glycol (MIRALAX / GLYCOLAX) packet Take 17 g by mouth daily as needed. For constipation.   Yes Historical Provider, MD  potassium chloride (K-DUR) 10 MEQ tablet Take 1 tablet (10 mEq total) by mouth 2 (two) times daily. 01/20/12  Yes Marc-Henry Nesi, MD  promethazine (PHENERGAN) 25 MG tablet Take 25 mg by mouth every 6 (six) hours as needed. For nausea   Yes Historical Provider, MD  propranolol (INDERAL) 20 MG tablet Take 20 mg by mouth 2 (two) times daily.    Yes Historical Provider, MD  HYDROCHLOROTHIAZIDE PO Take 1 tablet by mouth daily.    Historical Provider, MD  potassium chloride (K-DUR) 10 MEQ tablet Take 1 tablet (10 mEq total) by mouth daily. 12/30/11   Douglas Delo, MD    Allergies as of 04/24/2012 - Review Complete 04/24/2012  Allergen Reaction Noted  . Morphine Rash     Family History  Problem Relation Age of Onset  . Pancreatic cancer Maternal Grandmother   . Diabetes Mother   . Heart disease Father   . Diabetes Sister     History   Social History    . Marital Status: Married    Spouse Name: N/A    Number of Children: 2  . 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 Smoking status: Never Smoker   . Smokeless tobacco: Never Used  . Alcohol Use: Yes     Comment: OCCASIONAL  . Drug Use: No  . Sexually Active: Not on file   Other Topics Concern  . Not on file   Social History Narrative  . No narrative on file    Review of Systems: See HPI, otherwise negative ROS  Physical Exam: BP 136/84  Pulse 86  Temp 98.2 F (36.8 C) (Oral)  Ht 5' 6" (1.676 m)  Wt 140 lb 6.4 oz (63.685 kg)  BMI 22.66 kg/m2 General:   Alert,  Well-developed, well-nourished, pleasant and cooperative in NAD Skin:  Intact without   significant lesions or rashes.  She is significantly tanned (has a tanning bed at home a ) Eyes:  Sclera clear, no icterus.   Conjunctiva pink. Ears:  Normal auditory acuity. Nose:  No deformity, discharge,  or lesions. Mouth:  No deformity or lesions. Neck:  Supple; no masses or thyromegaly. No significant cervical adenopathy. Lungs:  Clear throughout to auscultation.   No wheezes, crackles, or rhonchi. No acute distress. Heart:  Regular rate and rhythm; no murmurs, clicks, rubs,  or gallops. Abdomen: Non-distended, normal bowel sounds.  Soft and nontender without appreciable mass or hepatosplenomegaly.  Pulses:  Normal pulses noted. Extremities:  Without clubbing or edema.  Impression/Plan:  Long-standing GERD well-controlled on Nexium. Now with recurrent esophageal dysphagia to solid food dysphagia known Schatzki's ring. Prior esophageal dilation helped significantly with her dysphagia symptoms. Chronic constipation well managed with MiraLax. She lost a significant amount of weight over the past year. At least, in part, related to her dysphagia symptoms. Weight loss otherwise non-specific but impresses. She tells me that she's had a battery [facility]. She reports her thyroid is normal.  She had colonoscopy some 11 years ago spent have only hemorrhoids at that time. Clinically, no bleeding. We need to check stool for occult blood. I would like to see copies of most recent lab work.  Recommendations:  I have offered patient an EGD with esophageal dilation as appropriate to further evaluate/intervene with her recent symptoms.The risks, benefits, limitations, alternatives and imponderables have been reviewed with the patient. Potential for esophageal dilation, biopsy, etc. have also been reviewed.  Will consider biopsies at time of EGD to evaluate further for celiac disease, eosinophilic gastroenteritis, etc. Questions have been answered. All parties agreeable.  Will also have patient collect a stool sample for occult blood testing. Will also review recent labs done elsewhere when they become available. Plan to premedicate with Phenergan on the day of the procedure.   Addendum: Hemoglobin and hematocrit 11.9 and 36.0 just 3 weeks ago. Potassium was low at 2.7 and 3.02 weeks ago.  Patient to followup with her other physicians regarding hypokalemia. 

## 2012-05-07 NOTE — Interval H&P Note (Signed)
History and Physical Interval Note:  05/07/2012 9:36 AM  Colleen Duncan  has presented today for surgery, with the diagnosis of Dysphagia GERD and weight loss  The various methods of treatment have been discussed with the patient and family. After consideration of risks, benefits and other options for treatment, the patient has consented to  Procedure(s) (LRB) with comments: ESOPHAGOGASTRODUODENOSCOPY (EGD) WITH ESOPHAGEAL DILATION (N/A) - 8:30-rescheduled to 9:30 [name] notified pt as a surgical intervention .  The patient's history has been reviewed, patient examined, no change in status, stable for surgery.  I have reviewed the patient's chart and labs.  Questions were answered to the patient's satisfaction.     [name]  As above. No change. EGD with esophageal dilation as appropriate today.The risks, benefits, limitations, alternatives and imponderables have been reviewed with the patient. Potential for esophageal dilation, biopsy, etc. have also been reviewed.  Questions have been answered. All parties agreeable.

## 2012-05-07 NOTE — Op Note (Signed)
[facility] [address] [place], [address]   ENDOSCOPY PROCEDURE REPORT  PATIENT: Colleen Duncan, Colleen Duncan  MR#: [id number] BIRTHDATE: [birth year] , 41  yrs. old GENDER: Female ENDOSCOPIST: R.  Roetta Sessions, MD FACP FACG REFERRED BY:  Artis Delay, M.D. PROCEDURE DATE:  05/07/2012 PROCEDURE:     EGD with [name] dilation and esophageal biopsy INDICATIONS:     history of GERD now with recurrent esophageal dysphagia/history of Schatzki's ring  INFORMED CONSENT:   The risks, benefits, limitations, alternatives and imponderables have been discussed.  The potential for biopsy, esophogeal dilation, etc. have also been reviewed.  Questions have been answered.  All parties agreeable.  Please see the history and physical in the medical record for more information.  MEDICATIONS: Versed 9 mg IV and Demerol 125 mg IV in divided doses. Phenergan 25 mg IV and Zofran 4 mg IV.  DESCRIPTION OF PROCEDURE:   The EG-[id number] ([id number])  endoscope was introduced through the mouth and advanced to the second portion of the duodenum without difficulty or limitations.  The mucosal surfaces were surveyed very carefully during advancement of the scope and upon withdrawal.  Retroflexion view of the proximal stomach and esophagogastric junction was performed.      FINDINGS: Patent tubular esophagus. Esophageal mucosa had a subtle "ringed" appearance ; no discrete Schatzki's ring identified.  No esophagitis. Small hiatal hernia. Much bile-stained mucus adherent to the gastric mucosa; No ulcer or infiltrating process., Worse patent. Normal first and second portion of the duodenum.  THERAPEUTIC / DIAGNOSTIC MANEUVERS PERFORMED:  A 54 French Maloney dilator was passed to full insertion easily. A look back revealed no apparent complication with this maneuver. Esophageal mucosa was not and all traumatized with this maneuver. Subsequently, biopsies of the distal and midesophagus were taken to evaluate  for underlying eosinophilic esophagitis.   COMPLICATIONS:  None  IMPRESSION:      Abnormal esophagus as described above of uncertain significance-status post passage of a Maloney dilator. Status post esophageal biopsy. Small hiatal hernia.  RECOMMENDATIONS:  Continue Nexium. Followup on pathology.    _______________________________ R. Roetta Sessions, MD FACP [facility] eSigned:  R. Roetta Sessions, MD FACP [facility] 05/07/2012 10:15 AM     CC:

## 2012-05-09 ENCOUNTER — Encounter: Payer: Self-pay | Admitting: Internal Medicine

## 2012-05-09 ENCOUNTER — Encounter (HOSPITAL_COMMUNITY): Payer: Self-pay | Admitting: Internal Medicine

## 2012-05-10 ENCOUNTER — Encounter: Payer: Self-pay | Admitting: *Deleted

## 2012-06-12 ENCOUNTER — Encounter: Payer: Self-pay | Admitting: Urgent Care

## 2012-06-12 ENCOUNTER — Ambulatory Visit (INDEPENDENT_AMBULATORY_CARE_PROVIDER_SITE_OTHER): Payer: BC Managed Care – PPO | Admitting: Urgent Care

## 2012-06-12 VITALS — BP 138/87 | HR 104 | Temp 97.4°F | Ht 66.0 in | Wt 132.0 lb

## 2012-06-12 DIAGNOSIS — R634 Abnormal weight loss: Secondary | ICD-10-CM

## 2012-06-12 DIAGNOSIS — K625 Hemorrhage of anus and rectum: Secondary | ICD-10-CM

## 2012-06-12 DIAGNOSIS — K219 Gastro-esophageal reflux disease without esophagitis: Secondary | ICD-10-CM

## 2012-06-12 DIAGNOSIS — R110 Nausea: Secondary | ICD-10-CM

## 2012-06-12 MED ORDER — PEG 3350-KCL-NA BICARB-NACL 420 G PO SOLR: 4000.0000 mL | ORAL | Status: DC

## 2012-06-12 NOTE — Patient Instructions (Addendum)
Stop headache powders or advil or aleve We will request your labs from Dr. Edison Simon office You will need colonoscopy with Dr. Jena Gauss Call us back if bleeding becomes more severe or worsening symptoms Rectal Bleeding Rectal bleeding is when blood passes out of the anus. It is usually a sign that something is wrong. It may not be serious, but it should always be evaluated. Rectal bleeding may present as bright red blood or extremely dark stools. The color may range from dark red or maroon to black (like tar). It is important that the cause of rectal bleeding be identified so treatment can be started and the problem corrected. CAUSES   Hemorrhoids. These are enlarged (dilated) blood vessels or veins in the anal or rectal area.  Fistulas. Theseare abnormal, burrowing channels that usually run from inside the rectum to the skin around the anus. They can bleed.  Anal fissures. This is a tear in the tissue of the anus. Bleeding occurs with bowel movements.  Diverticulosis. This is a condition in which pockets or sacs project from the bowel wall. Occasionally, the sacs can bleed.  Diverticulitis. Thisis an infection involving diverticulosis of the colon.  Proctitis and colitis. These are conditions in which the rectum, colon, or both, can become inflamed and pitted (ulcerated).  Polyps and cancer. Polyps are non-cancerous (benign) growths in the colon that may bleed. Certain types of polyps turn into cancer.  Protrusion of the rectum. Part of the rectum can project from the anus and bleed.  Certain medicines.  Intestinal infections.  Blood vessel abnormalities. HOME CARE INSTRUCTIONS  Eat a high-fiber diet to keep your stool soft.  Limit activity.  Drink enough fluids to keep your urine clear or pale yellow.  Warm baths may be useful to soothe rectal pain.  Follow up with your caregiver as directed. SEEK IMMEDIATE MEDICAL CARE IF:  You develop increased bleeding.  You have  black or dark red stools.  You vomit blood or material that looks like coffee grounds.  You have abdominal pain or tenderness.  You have a fever.  You feel weak, nauseous, or you faint.  You have severe rectal pain or you are unable to have a bowel movement. MAKE SURE YOU:  Understand these instructions.  Will watch your condition.  Will get help right away if you are not doing well or get worse. Document Released: 09/17/2001 Document Revised: 06/20/2011 Document Reviewed: 09/12/2010 [facility] Patient Information 2013 [place], [place].

## 2012-06-12 NOTE — Assessment & Plan Note (Addendum)
40# in past year unintentional.  See rectal bleeding & nausea.

## 2012-06-12 NOTE — Progress Notes (Signed)
Primary Care Physician:  Cassell Smiles., MD Primary Gastroenterologist:  Dr. Jena Gauss  Chief Complaint  Patient presents with  . Irritable Bowel Syndrome  . Abdominal Pain  . Hematochezia    HPI:  Colleen Duncan is a 42 y.o. female here for follow up.  She has hx IBS, chronic abdominal pain, nausea & new problem of rectal bleeding.  She had a recent EGD by Dr Jena Gauss that was benign with benign biopsy.  She woke up last night straining with urination.  She gives hx interstitial cystitis & sees Dr Brunilda Payor for this. She has been having abdominal bloating & gas.  This was followed by a yellow bloody mucus discharge from her rectum since yesterday.  She tried miralax but can't take too much as ir causes diarrhea.  C/o chronic nausea & anorexia.  Ate a few crackers today.  Nausea all day long but denies vomiting.  She has hx hypokalemia.  We have documented 40# weight loss in the past year.  She has not been dieting.  Her TSH was checked at Dr. Ewell Poe, her gynecologist.  She lost her fiance in Feb 2009 & has suffered from anxiety & panic attacks since that time.  She remarried her 1st husband & separated 2 yrs ago from him.  She has had some underlying anxiety with all this.  She takes BC powders once daily 5 days per week for headaches.  No recent colonoscopy.  05/12/12 labs: CBC normal, CMP normal except glucose 127.  Mag normal.  Cortisol 20.1.  Aldosterone <1.  Renin 7.4.    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 No further imaging followup is needed.   Vitals - 1 value per visit 06/12/2012 05/07/2012 04/24/2012 01/20/2012  Weight (lb) 132 140 140.4 141   Vitals - 1 value per visit 01/19/2012 12/30/2011 11/04/2011 05/26/2011  Weight (lb)  149  170.6   Past Medical History  Diagnosis Date  . PONV (postoperative nausea and vomiting)   . Hypertension     on inderal-instructed to take dos  . Seasonal allergies   . GERD (gastroesophageal  reflux disease)     nexium-instructed to take dos  . Depression   . Irritable bowel   . Interstitial cystitis   . Migraine     migraines-treated with inderal along with treatment for htn  . Chronic neck pain   . DDD (degenerative disc disease), cervical   . Liver nodule STABLE BENIGN FOCAL HYPERPLASIA NODULAR LEFT HEPATIC LOBE    PER MRI  01-17-2012  . History of head injury 2012-- LOC BUT NO RESIDUALS (PT RACES GO-CARTS)  . Frequency of urination   . Urgency of urination   . Nocturia   . Right arm weakness SECONARDAY TO CERVICAL FUSION  . Anxiety   . Anxiety attack   . Hemorrhoids    Past Surgical History  Procedure Laterality Date  . Knee arthroscopy  1992  &  1988  . Ectopic pregnancy surgery  1993  . Tonsillectomy    . Laparoscopy  01/04/2011    Procedure: LAPAROSCOPY DIAGNOSTIC;  Surgeon: Melony Overly;  Location: [facility];  Service: Gynecology;  Laterality: N/A;  Bilateral salpingoectomy/ LYSIS ADHESIONS  . Cysto/  hod  01-26-2010 ;  09-01-2003 ;   DR NESI    CHRONIC I.C.  . Anterior cervical decomp/discectomy fusion  04-25-2005    C6 - 7  . Right ureteroscopic stone extraction  05-31-2002  . Laparoscopic  cholecystectomy  11-02-2001  . Vaginal hysterectomy  07-05-2001  . Cysto with hydrodistension  01/20/2012    Procedure: CYSTOSCOPY/HYDRODISTENSION;  Surgeon: Lindaann Slough, MD;  Location: [facility];  Service: Urology;  Laterality: N/A;  Bladder instillation of Marcaine and Pyridium   . Esophagogastroduodenoscopy  02/15/2007    Dr. Jena Gauss- noncritical subtle schatzki's ring, o/w normal. tiny hiatal hernia o/w normal stomach  . Esophagogastroduodenoscopy  06/18/2007    Dr. Elmer Ramp esophagus, stomach, D1 and D2  . Bravo ph study  06/18/2007    Dr. Jena Gauss- study indicates extremely good control of gastroesophageal reflux while on acid supppression therapy.  . Esophagogastroduodenoscopy (egd) with esophageal dilation  05/07/2012    Rourk-small HH, benign  esophageal bx, Maloney dilation    Current Outpatient Prescriptions  Medication Sig Dispense Refill  . amitriptyline (ELAVIL) 10 MG tablet Take 10 mg by mouth at bedtime.      Marland Kitchen amLODipine (NORVASC) 2.5 MG tablet Take 2.5 mg by mouth daily.       . Aspirin-Acetaminophen-Caffeine (GOODY HEADACHE PO) Take 1 packet by mouth as needed.       . diphenhydrAMINE (BENADRYL) 25 mg capsule Take 25 mg by mouth at bedtime as needed. For allergies      . esomeprazole (NEXIUM) 40 MG capsule Take 40 mg by mouth daily before breakfast.       . hyoscyamine (LEVSIN SL) 0.125 MG SL tablet Place 0.125 mg under the tongue every 4 (four) hours as needed.      Marland Kitchen ibuprofen (ADVIL,MOTRIN) 200 MG tablet Take 400-600 mg by mouth every 8 (eight) hours as needed. For pain.      Marland Kitchen oxyCODONE-acetaminophen (PERCOCET) 5-325 MG per tablet Take 1 tablet by mouth every 4 (four) hours as needed. For pain.      . pentosan polysulfate (ELMIRON) 100 MG capsule Take 100 mg by mouth 2 (two) times daily.       . polyethylene glycol (MIRALAX / GLYCOLAX) packet Take 17 g by mouth daily as needed. For constipation.      . potassium chloride (K-DUR) 10 MEQ tablet Take 10 mEq by mouth 3 (three) times daily.      . promethazine (PHENERGAN) 25 MG tablet Take 25 mg by mouth every 6 (six) hours as needed. For nausea      . propranolol (INDERAL) 20 MG tablet Take 20 mg by mouth 2 (two) times daily.       Marland Kitchen ALPRAZolam (XANAX) 0.5 MG tablet Take 0.5 mg by mouth 3 (three) times daily as needed. For anxiety.      . polyethylene glycol-electrolytes (TRILYTE) 420 G solution Take 4,000 mLs by mouth as directed.  4000 mL  0   No current facility-administered medications for this visit.    Allergies as of 06/12/2012 - Review Complete 06/12/2012  Allergen Reaction Noted  . Morphine Rash     Family History  Problem Relation Age of Onset  . Pancreatic cancer Maternal Grandmother   . Diabetes Mother   . Heart disease Father   . Diabetes Sister      History   Social History  . Marital Status: Married    Spouse Name: N/A    Number of Children: 2  . 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 Smoking status: Never Smoker   . Smokeless tobacco: Never Used  . Alcohol Use: Yes     Comment: OCCASIONAL  .  Drug Use: No  . Sexually Active: Not on file   Other Topics Concern  . Not on file   Social History Narrative  . No narrative on file    Review of Systems: Gen: see HPI CV: Denies chest pain, angina, palpitations, syncope, orthopnea, PND, peripheral edema, and claudication. Resp: Denies dyspnea at rest, dyspnea with exercise, cough, sputum, wheezing, coughing up blood, and pleurisy. GI: Denies vomiting blood, jaundice, and fecal incontinence. GU : see HPI. MS: Denies joint pain, limitation of movement, and swelling, stiffness, low back pain, extremity pain. Denies muscle weakness, cramps, atrophy.  Derm: Denies rash, itching, dry skin, hives, moles, warts, or unhealing ulcers.  Psych: Denies depression, anxiety, memory loss, suicidal ideation, hallucinations, paranoia, and confusion. Heme: Denies bruising, bleeding, and enlarged lymph nodes. Neuro:  Denies any dizziness,paresthesias. Endo:  Denies any problems with DM, thyroid, adrenal function.  Physical Exam: BP 138/87  Pulse 104  Temp(Src) 97.4 F (36.3 C) (Oral)  Ht 5\' 6"  (1.676 m)  Wt 132 lb (59.875 kg)  BMI 21.32 kg/m2 No LMP recorded. Patient has had a hysterectomy. General:   Alert,  Well-developed, well-nourished, pleasant and cooperative in NAD Head:  Normocephalic and atraumatic. Eyes:  Sclera clear, no icterus.   Conjunctiva pink. Ears:  Normal auditory acuity. Nose:  No deformity, discharge, or lesions. Mouth:  No deformity or lesions,oropharynx pink & moist. Neck:  Supple; no masses or thyromegaly. Lungs:  Clear throughout to auscultation.   No wheezes, crackles, or rhonchi. No acute  distress. Heart:  Regular rate and rhythm; no murmurs, clicks, rubs,  or gallops. Abdomen:  Normal bowel sounds.  No bruits.  Soft, non-tender and non-distended without masses, hepatosplenomegaly or hernias noted.  No guarding or rebound tenderness.   Rectal:  Deferred. Msk:  Symmetrical without gross deformities. Normal posture. Pulses:  Normal pulses noted. Extremities:  No clubbing or edema. Neurologic:  Alert and  oriented x4;  grossly normal neurologically. Skin:  Intact without significant lesions or rashes. Lymph Nodes:  No significant cervical adenopathy. Psych:  Alert and cooperative. Normal mood and affect.

## 2012-06-13 ENCOUNTER — Encounter: Payer: Self-pay | Admitting: Urgent Care

## 2012-06-13 NOTE — Assessment & Plan Note (Addendum)
EGD with biopsies benign.  Consider GES if nothing to explain weight loss on colonoscopy.  Continue phenergan 25mg  q6h PRN N/V

## 2012-06-13 NOTE — Assessment & Plan Note (Signed)
Well-controlled on Nexium 40 mg daily. 

## 2012-06-13 NOTE — Assessment & Plan Note (Addendum)
Colleen Duncan is a pleasant 42 y.o. female with new onset rectal bleeding with hx IBS & chronic nausea.  She had a recent normal EGD.  40# weight loss in past year.  She does take headache powders.  Differentials include NSAID-induced colitis,inflammatory bowel disease, colorectal carcinoma or benign anorectal bleeding.  I have discussed risks & benefits which include, but are not limited to, bleeding, infection, perforation & drug reaction.  The patient agrees with this plan & written consent will be obtained.    Phenergan 25mg  IV 30 min prior to procedure to augment sedation given multiple psychoactive medications Stop headache powders or advil or aleve Call us back if bleeding becomes more severe or worsening symptoms Rectal bleeding literature

## 2012-06-14 LAB — CMP AND LIVER
ALT: 12 U/L (ref 7–35)
AST: 14 U/L
Calcium: 9.7 mg/dL
Potassium: 4.3 mmol/L
Sodium: 139 mmol/L (ref 137–147)
Total Bilirubin: 0.4 mg/dL
Total Protein: 6.7 g/dL

## 2012-06-14 LAB — CBC WITH DIFFERENTIAL/PLATELET
HCT: 36 %
Hemoglobin: 12 g/dL (ref 12.0–16.0)
platelet count: 340

## 2012-06-14 NOTE — Progress Notes (Signed)
Faxed to PCP

## 2012-06-15 ENCOUNTER — Encounter (HOSPITAL_COMMUNITY): Payer: Self-pay | Admitting: Pharmacy Technician

## 2012-06-18 ENCOUNTER — Ambulatory Visit (HOSPITAL_COMMUNITY)
Admission: RE | Admit: 2012-06-18 | Discharge: 2012-06-18 | Disposition: A | Discharge status: Home / Self Care | Payer: BC Managed Care – PPO | Source: Ambulatory Visit | Attending: Internal Medicine | Admitting: Internal Medicine

## 2012-06-18 ENCOUNTER — Encounter (HOSPITAL_COMMUNITY)
Admission: RE | Disposition: A | Discharge status: Home / Self Care | Payer: Self-pay | Source: Ambulatory Visit | Attending: Internal Medicine

## 2012-06-18 ENCOUNTER — Encounter (HOSPITAL_COMMUNITY): Payer: Self-pay | Admitting: *Deleted

## 2012-06-18 DIAGNOSIS — K645 Perianal venous thrombosis: Secondary | ICD-10-CM

## 2012-06-18 DIAGNOSIS — R634 Abnormal weight loss: Secondary | ICD-10-CM

## 2012-06-18 DIAGNOSIS — K5289 Other specified noninfective gastroenteritis and colitis: Secondary | ICD-10-CM | POA: Insufficient documentation

## 2012-06-18 DIAGNOSIS — R110 Nausea: Secondary | ICD-10-CM

## 2012-06-18 DIAGNOSIS — K921 Melena: Secondary | ICD-10-CM

## 2012-06-18 DIAGNOSIS — K633 Ulcer of intestine: Secondary | ICD-10-CM

## 2012-06-18 DIAGNOSIS — K219 Gastro-esophageal reflux disease without esophagitis: Secondary | ICD-10-CM

## 2012-06-18 DIAGNOSIS — I10 Essential (primary) hypertension: Secondary | ICD-10-CM | POA: Insufficient documentation

## 2012-06-18 DIAGNOSIS — K5900 Constipation, unspecified: Secondary | ICD-10-CM

## 2012-06-18 DIAGNOSIS — K625 Hemorrhage of anus and rectum: Secondary | ICD-10-CM

## 2012-06-18 HISTORY — PX: COLONOSCOPY: SHX5424

## 2012-06-18 SURGERY — COLONOSCOPY
Anesthesia: Moderate Sedation

## 2012-06-18 MED ORDER — PROMETHAZINE HCL 25 MG/ML IJ SOLN
INTRAMUSCULAR | Status: AC
  Filled 2012-06-18: qty 1

## 2012-06-18 MED ORDER — MIDAZOLAM HCL 5 MG/5ML IJ SOLN
INTRAMUSCULAR | Status: DC | PRN
  Administered 2012-06-18: 1 mg via INTRAVENOUS
  Administered 2012-06-18 (×3): 2 mg via INTRAVENOUS

## 2012-06-18 MED ORDER — MIDAZOLAM HCL 5 MG/5ML IJ SOLN
INTRAMUSCULAR | Status: AC
  Filled 2012-06-18: qty 10

## 2012-06-18 MED ORDER — SODIUM CHLORIDE 0.45 % IV SOLN
INTRAVENOUS | Status: DC
  Administered 2012-06-18: 14:00:00 via INTRAVENOUS

## 2012-06-18 MED ORDER — MEPERIDINE HCL 100 MG/ML IJ SOLN
INTRAMUSCULAR | Status: DC | PRN
  Administered 2012-06-18 (×3): 50 mg via INTRAVENOUS

## 2012-06-18 MED ORDER — PROMETHAZINE HCL 25 MG/ML IJ SOLN
25.0000 mg | Freq: Once | INTRAMUSCULAR | Status: AC
  Administered 2012-06-18: 25 mg via INTRAVENOUS

## 2012-06-18 MED ORDER — MEPERIDINE HCL 100 MG/ML IJ SOLN
INTRAMUSCULAR | Status: AC
  Filled 2012-06-18: qty 2

## 2012-06-18 MED ORDER — ONDANSETRON HCL 4 MG/2ML IJ SOLN
INTRAMUSCULAR | Status: AC
  Filled 2012-06-18: qty 2

## 2012-06-18 MED ORDER — STERILE WATER FOR IRRIGATION IR SOLN
Status: DC | PRN
  Administered 2012-06-18: 15:00:00

## 2012-06-18 MED ORDER — ONDANSETRON HCL 4 MG/2ML IJ SOLN
INTRAMUSCULAR | Status: DC | PRN
  Administered 2012-06-18: 4 mg via INTRAVENOUS

## 2012-06-18 MED ORDER — SODIUM CHLORIDE 0.9 % IJ SOLN
INTRAMUSCULAR | Status: AC
  Filled 2012-06-18: qty 10

## 2012-06-18 NOTE — Interval H&P Note (Signed)
History and Physical Interval Note:  06/18/2012 3:15 PM  Colleen Duncan  has presented today for surgery, with the diagnosis of WEIGHT LOSS, ANOREXIA, RECTAL BLEEDING  The various methods of treatment have been discussed with the patient and family. After consideration of risks, benefits and other options for treatment, the patient has consented to  Procedure(s) with comments: COLONOSCOPY (N/A) - 1:00 as a surgical intervention .  The patient's history has been reviewed, patient examined, no change in status, stable for surgery.  I have reviewed the patient's chart and labs.  Questions were answered to the patient's satisfaction.     Colonoscopy per plan to evaluate hematochezia. The risks, benefits, limitations, alternatives and imponderables have been reviewed with the patient. Questions have been answered. All parties are agreeable.   [name]

## 2012-06-18 NOTE — H&P (View-Only) (Signed)
Primary Care Physician:  FUSCO,LAWRENCE J., MD Primary Gastroenterologist:  Dr. Rourk  Chief Complaint  Patient presents with  . Irritable Bowel Syndrome  . Abdominal Pain  . Hematochezia    HPI:  Colleen Duncan is a 41 y.o. female here for follow up.  She has hx IBS, chronic abdominal pain, nausea & new problem of rectal bleeding.  She had a recent EGD by Dr Rourk that was benign with benign biopsy.  She woke up last night straining with urination.  She gives hx interstitial cystitis & sees Dr Nesi for this. She has been having abdominal bloating & gas.  This was followed by a yellow bloody mucus discharge from her rectum since yesterday.  She tried miralax but can't take too much as ir causes diarrhea.  C/o chronic nausea & anorexia.  Ate a few crackers today.  Nausea all day long but denies vomiting.  She has hx hypokalemia.  We have documented 40# weight loss in the past year.  She has not been dieting.  Her TSH was checked at Dr. Anderson's, her gynecologist.  She lost her fiance in Feb 2009 & has suffered from anxiety & panic attacks since that time.  She remarried her 1st husband & separated 2 yrs ago from him.  She has had some underlying anxiety with all this.  She takes BC powders once daily 5 days per week for headaches.  No recent colonoscopy.  05/12/12 labs: CBC normal, CMP normal except glucose 127.  Mag normal.  Cortisol 20.1.  Aldosterone <1.  Renin 7.4.    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 No further imaging followup is needed.   Vitals - 1 value per visit 06/12/2012 05/07/2012 04/24/2012 01/20/2012  Weight (lb) 132 140 140.4 141   Vitals - 1 value per visit 01/19/2012 12/30/2011 11/04/2011 05/26/2011  Weight (lb)  149  170.6   Past Medical History  Diagnosis Date  . PONV (postoperative nausea and vomiting)   . Hypertension     on inderal-instructed to take dos  . Seasonal allergies   . GERD (gastroesophageal  reflux disease)     nexium-instructed to take dos  . Depression   . Irritable bowel   . Interstitial cystitis   . Migraine     migraines-treated with inderal along with treatment for htn  . Chronic neck pain   . DDD (degenerative disc disease), cervical   . Liver nodule STABLE BENIGN FOCAL HYPERPLASIA NODULAR LEFT HEPATIC LOBE    PER MRI  01-17-2012  . History of head injury 2012-- LOC BUT NO RESIDUALS (PT RACES GO-CARTS)  . Frequency of urination   . Urgency of urination   . Nocturia   . Right arm weakness SECONARDAY TO CERVICAL FUSION  . Anxiety   . Anxiety attack   . Hemorrhoids    Past Surgical History  Procedure Laterality Date  . Knee arthroscopy  1992  &  1988  . Ectopic pregnancy surgery  1993  . Tonsillectomy    . Laparoscopy  01/04/2011    Procedure: LAPAROSCOPY DIAGNOSTIC;  Surgeon: Brook A Silva;  Location: [facility];  Service: Gynecology;  Laterality: N/A;  Bilateral salpingoectomy/ LYSIS ADHESIONS  . Cysto/  hod  01-26-2010 ;  09-01-2003 ;   DR NESI    CHRONIC I.C.  . Anterior cervical decomp/discectomy fusion  04-25-2005    C6 - 7  . Right ureteroscopic stone extraction  05-31-2002  . Laparoscopic   cholecystectomy  11-02-2001  . Vaginal hysterectomy  07-05-2001  . Cysto with hydrodistension  01/20/2012    Procedure: CYSTOSCOPY/HYDRODISTENSION;  Surgeon: Marc-Henry Nesi, MD;  Location: [facility];  Service: Urology;  Laterality: N/A;  Bladder instillation of Marcaine and Pyridium   . Esophagogastroduodenoscopy  02/15/2007    Dr. Rourk- noncritical subtle schatzki's ring, o/w normal. tiny hiatal hernia o/w normal stomach  . Esophagogastroduodenoscopy  06/18/2007    Dr. Rourk-normal esophagus, stomach, D1 and D2  . Bravo ph study  06/18/2007    Dr. Rourk- study indicates extremely good control of gastroesophageal reflux while on acid supppression therapy.  . Esophagogastroduodenoscopy (egd) with esophageal dilation  05/07/2012    Rourk-small HH, benign  esophageal bx, Maloney dilation    Current Outpatient Prescriptions  Medication Sig Dispense Refill  . amitriptyline (ELAVIL) 10 MG tablet Take 10 mg by mouth at bedtime.      . amLODipine (NORVASC) 2.5 MG tablet Take 2.5 mg by mouth daily.       . Aspirin-Acetaminophen-Caffeine (GOODY HEADACHE PO) Take 1 packet by mouth as needed.       . diphenhydrAMINE (BENADRYL) 25 mg capsule Take 25 mg by mouth at bedtime as needed. For allergies      . esomeprazole (NEXIUM) 40 MG capsule Take 40 mg by mouth daily before breakfast.       . hyoscyamine (LEVSIN SL) 0.125 MG SL tablet Place 0.125 mg under the tongue every 4 (four) hours as needed.      . ibuprofen (ADVIL,MOTRIN) 200 MG tablet Take 400-600 mg by mouth every 8 (eight) hours as needed. For pain.      . oxyCODONE-acetaminophen (PERCOCET) 5-325 MG per tablet Take 1 tablet by mouth every 4 (four) hours as needed. For pain.      . pentosan polysulfate (ELMIRON) 100 MG capsule Take 100 mg by mouth 2 (two) times daily.       . polyethylene glycol (MIRALAX / GLYCOLAX) packet Take 17 g by mouth daily as needed. For constipation.      . potassium chloride (K-DUR) 10 MEQ tablet Take 10 mEq by mouth 3 (three) times daily.      . promethazine (PHENERGAN) 25 MG tablet Take 25 mg by mouth every 6 (six) hours as needed. For nausea      . propranolol (INDERAL) 20 MG tablet Take 20 mg by mouth 2 (two) times daily.       . ALPRAZolam (XANAX) 0.5 MG tablet Take 0.5 mg by mouth 3 (three) times daily as needed. For anxiety.      . polyethylene glycol-electrolytes (TRILYTE) 420 G solution Take 4,000 mLs by mouth as directed.  4000 mL  0   No current facility-administered medications for this visit.    Allergies as of 06/12/2012 - Review Complete 06/12/2012  Allergen Reaction Noted  . Morphine Rash     Family History  Problem Relation Age of Onset  . Pancreatic cancer Maternal Grandmother   . Diabetes Mother   . Heart disease Father   . Diabetes Sister      History   Social History  . Marital Status: Married    Spouse Name: N/A    Number of Children: 2  . 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 Smoking status: Never Smoker   . Smokeless tobacco: Never Used  . Alcohol Use: Yes     Comment: OCCASIONAL  .   Drug Use: No  . Sexually Active: Not on file   Other Topics Concern  . Not on file   Social History Narrative  . No narrative on file    Review of Systems: Gen: see HPI CV: Denies chest pain, angina, palpitations, syncope, orthopnea, PND, peripheral edema, and claudication. Resp: Denies dyspnea at rest, dyspnea with exercise, cough, sputum, wheezing, coughing up blood, and pleurisy. GI: Denies vomiting blood, jaundice, and fecal incontinence. GU : see HPI. MS: Denies joint pain, limitation of movement, and swelling, stiffness, low back pain, extremity pain. Denies muscle weakness, cramps, atrophy.  Derm: Denies rash, itching, dry skin, hives, moles, warts, or unhealing ulcers.  Psych: Denies depression, anxiety, memory loss, suicidal ideation, hallucinations, paranoia, and confusion. Heme: Denies bruising, bleeding, and enlarged lymph nodes. Neuro:  Denies any dizziness,paresthesias. Endo:  Denies any problems with DM, thyroid, adrenal function.  Physical Exam: BP 138/87  Pulse 104  Temp(Src) 97.4 F (36.3 C) (Oral)  Ht 5' 6" (1.676 m)  Wt 132 lb (59.875 kg)  BMI 21.32 kg/m2 No LMP recorded. Patient has had a hysterectomy. General:   Alert,  Well-developed, well-nourished, pleasant and cooperative in NAD Head:  Normocephalic and atraumatic. Eyes:  Sclera clear, no icterus.   Conjunctiva pink. Ears:  Normal auditory acuity. Nose:  No deformity, discharge, or lesions. Mouth:  No deformity or lesions,oropharynx pink & moist. Neck:  Supple; no masses or thyromegaly. Lungs:  Clear throughout to auscultation.   No wheezes, crackles, or rhonchi. No acute  distress. Heart:  Regular rate and rhythm; no murmurs, clicks, rubs,  or gallops. Abdomen:  Normal bowel sounds.  No bruits.  Soft, non-tender and non-distended without masses, hepatosplenomegaly or hernias noted.  No guarding or rebound tenderness.   Rectal:  Deferred. Msk:  Symmetrical without gross deformities. Normal posture. Pulses:  Normal pulses noted. Extremities:  No clubbing or edema. Neurologic:  Alert and  oriented x4;  grossly normal neurologically. Skin:  Intact without significant lesions or rashes. Lymph Nodes:  No significant cervical adenopathy. Psych:  Alert and cooperative. Normal mood and affect.   

## 2012-06-18 NOTE — Op Note (Signed)
[facility] [address] [place], [address]   COLONOSCOPY PROCEDURE REPORT  PATIENT: Colleen, Duncan  MR#:         [id number] BIRTHDATE: Nov 21, 1970 , 41  yrs. old GENDER: Female ENDOSCOPIST: R.  Roetta Sessions, MD FACP FACG REFERRED BY:  Artis Delay, M.D. PROCEDURE DATE:  06/18/2012 PROCEDURE:     ileocolonoscopy with biopsy  INDICATIONS: Hematochezia in the setting of constipation  INFORMED CONSENT:  The risks, benefits, alternatives and imponderables including but not limited to bleeding, perforation as well as the possibility of a missed lesion have been reviewed.  The potential for biopsy, lesion removal, etc. have also been discussed.  Questions have been answered.  All parties agreeable. Please see the history and physical in the medical record for more information.  MEDICATIONS: Versed 7 mg IV and Demerol 150 mg IV. Zofran 4 mg IV and Phenergan 25 mg IV  DESCRIPTION OF PROCEDURE:  After a digital rectal exam was performed, the EC-[id number] ([id number])  colonoscope was advanced from the anus through the rectum and colon to the area of the cecum, ileocecal valve and appendiceal orifice.  The cecum was deeply intubated.  These structures were well-seen and photographed for the record.  From the level of the cecum and ileocecal valve, the scope was slowly and cautiously withdrawn.  The mucosal surfaces were carefully surveyed utilizing scope tip deflection to facilitate fold flattening as needed.  The scope was pulled down into the rectum where a thorough examination including retroflexion was performed.    FINDINGS:  Adequate preparation. Patient has (1) 7-8 mm thrombosed hemorrhoid "ball valving" in the anal canal; otherwise, the remainder of the rectal mucosa appeared normal. Multiple 1-2 mm ulcer scattered throughout her sigmoid and descending colon. The more proximal colon appeared entirely normal.  The terminal ileal mucosa appeared normal for 10  cm  THERAPEUTIC / DIAGNOSTIC MANEUVERS PERFORMED:  Biopsies of the abnormal left colon taken for histologic study  COMPLICATIONS: None  CECAL WITHDRAWAL TIME:  14 minutes  IMPRESSION:    Thrombosed anal canal hemorrhoid-likely source of some of her anal pain and hematochezia. Tiny colonic ulcers as described above of uncertain significance. Query Elmiron/NSAID effect.   Recommendations:  Ten-day course of Anusol-HC suppositories. Patient may need definitive surgical therapy for hemorrhoidal disease. Followup on pathology. Benefiber 2 tablespoons daily. MiraLax one half  capful to one capsule daily as needed for constipation.     _______________________________ eSigned:  R. Roetta Sessions, MD FACP [facility] 06/18/2012 4:22 PM   CC:    PATIENT NAME:  Colleen, Duncan MR#: [id number]

## 2012-06-22 ENCOUNTER — Encounter (HOSPITAL_COMMUNITY): Payer: Self-pay | Admitting: Internal Medicine

## 2012-06-24 ENCOUNTER — Encounter: Payer: Self-pay | Admitting: Internal Medicine

## 2012-06-25 ENCOUNTER — Encounter: Payer: Self-pay | Admitting: *Deleted

## 2012-06-25 NOTE — Progress Notes (Unsigned)
Letter from: Corbin Ade  Reason for Letter: Results Review  Send letter to patient.  Send copy of letter with path to referring provider and PCP.  Needs ov w extender in 1 month

## 2012-06-25 NOTE — Progress Notes (Signed)
Path and letter faxed to PCP, letter mailed to pt, appt scheduled

## 2012-07-25 ENCOUNTER — Encounter: Payer: Self-pay | Admitting: Internal Medicine

## 2012-07-30 ENCOUNTER — Ambulatory Visit (INDEPENDENT_AMBULATORY_CARE_PROVIDER_SITE_OTHER): Payer: BC Managed Care – PPO | Admitting: Gastroenterology

## 2012-07-30 ENCOUNTER — Encounter: Payer: Self-pay | Admitting: Gastroenterology

## 2012-07-30 VITALS — BP 120/79 | HR 82 | Temp 97.4°F | Ht 66.0 in | Wt 131.8 lb

## 2012-07-30 DIAGNOSIS — K5900 Constipation, unspecified: Secondary | ICD-10-CM

## 2012-07-30 DIAGNOSIS — R634 Abnormal weight loss: Secondary | ICD-10-CM

## 2012-07-30 DIAGNOSIS — K649 Unspecified hemorrhoids: Secondary | ICD-10-CM

## 2012-07-30 DIAGNOSIS — R112 Nausea with vomiting, unspecified: Secondary | ICD-10-CM

## 2012-07-30 DIAGNOSIS — R1013 Epigastric pain: Secondary | ICD-10-CM

## 2012-07-30 DIAGNOSIS — E876 Hypokalemia: Secondary | ICD-10-CM

## 2012-07-30 LAB — BASIC METABOLIC PANEL
CO2: 29 mEq/L (ref 19–32)
Chloride: 102 mEq/L (ref 96–112)
Sodium: 139 mEq/L (ref 135–145)

## 2012-07-30 LAB — LIPASE: Lipase: 30 U/L (ref 11–59)

## 2012-07-30 MED ORDER — ESOMEPRAZOLE MAGNESIUM 40 MG PO CPDR: 40.0000 mg | DELAYED_RELEASE_CAPSULE | Freq: Two times a day (BID) | ORAL | Status: DC

## 2012-07-30 NOTE — Progress Notes (Signed)
Cc PCP 

## 2012-07-30 NOTE — Assessment & Plan Note (Signed)
Inactive at this time. 

## 2012-07-30 NOTE — Assessment & Plan Note (Signed)
42 year old female with abnormal weight loss, frequent/nausea vomiting, epigastric pain as outlined above. She has a history of chronic aspirin powders, ibuprofen, Elmiron use. She has eliminated all exposure except for daily ibuprofen for her neck pain. EGD in January 2004 obtain as outlined above without evidence of gastritis or peptic ulcer disease. Patient reports thyroid function checked previously. Recommend gastric emptying study for further evaluation of her symptoms. Suggest she further limit ibuprofen use as possible. Increase her Nexium to 40 mg twice a day before meals. Her history is complicated by significant interstitial cystitis. She plans to get a second opinion regarding this. We'll also check a lipase for completion.  She has history of hypokalemia and has been out of her potassium for 2 weeks. Previously developed hypokalemia on diuretic therapy. At this point we'll recheck her potassium level and decide whether she needs ongoing potassium supplement.

## 2012-07-30 NOTE — Patient Instructions (Addendum)
Increase Nexium to twice a day, 30 minutes before breakfast and 30 minutes before your evening meal. Prescription sent to your pharmacy.  Please have your blood work done.  Please have your gastric emptying study done.

## 2012-07-30 NOTE — Progress Notes (Signed)
Primary Care Physician: Cassell Smiles., MD  Primary Gastroenterologist:  Roetta Sessions, MD   Chief Complaint  Patient presents with  . Follow-up  . Nausea    HPI: Colleen Duncan is a 42 y.o. female with history of irritable bowel syndrome, chronic abdominal pain, interstitial cystitis, nausea and recent rectal bleeding. She is here today for followup of recent colonoscopy. She had thrombosed anal canal hemorrhoids likely the source of some of her anal pain and hematochezia. She had tiny colonic ulcers throughout her sigmoid and descending colon. Terminal ileum was normal. Biopsies showed acute ulceration from benign disease, likely related to aspirin/NSAIDS plus or minus Elmiron use. Patient was instructed to stop aspirin but to continue Elmiron.  Patient with history of 40 pound weight loss well documented dating back to February 2013. She states she really started having problems EGD and back in May 2013.  In February 2013 she weighed 170 pounds. In March of 2014 she weighed 132 pounds.  Her weight is stable today. She has frequent episodes of nausea and vomiting. She is status post cholecystectomy remotely. Her last endoscopy was in January of this year at which time she had small hiatal hernia, benign esophageal biopsy, esophageal dilation. She complains of postprandial nausea that occurs almost immediately with meals. She will vomit food that she ate 2 meals prior. She feels like food sours on her stomach. Complains of epigastric tenderness related to this. Sometimes she is able to control her symptoms by consuming only liquids but at other times she vomits liquids as well. Symptoms tend to be worse if she is constipated. Overall her heartburn seems to be well-controlled. She has the head of her bed elevated on blocks. Sometimes she wakes up vomiting. She takes Benefiber TID, Miralax 1/2 capful daily. BMs regular as long as able to eat. Very little straining. Took two weeks after the TCS for BMs  to start. This started the nausea/vomiting this last time. No further bleeding and no rectal bleeding. Drinking Boost. Some vomiting this past Thursday and Friday. Occuring weekly. Out of potassium for two weeks. Advil 200mg  4-6 per day.   Cannot tell that any treatment has helped interstitial cystitis. She decided to stop Elmiron on her own as it seemed to not be helping. States she has eliminated as much stress from her life as possibly. She had domestic stress. She likes her job.   Current Outpatient Prescriptions  Medication Sig Dispense Refill  . ALPRAZolam (XANAX) 0.25 MG tablet Take 0.25 mg by mouth at bedtime as needed.       Marland Kitchen amitriptyline (ELAVIL) 10 MG tablet Take 10 mg by mouth at bedtime.      Marland Kitchen amLODipine (NORVASC) 2.5 MG tablet Take 2.5 mg by mouth daily.       . diphenhydrAMINE (BENADRYL) 25 mg capsule Take 25 mg by mouth at bedtime as needed. For allergies      . esomeprazole (NEXIUM) 40 MG capsule Take 40 mg by mouth daily before breakfast.       . hyoscyamine (LEVSIN SL) 0.125 MG SL tablet Place 0.125 mg under the tongue every 4 (four) hours as needed for cramping.       Marland Kitchen ibuprofen (ADVIL,MOTRIN) 200 MG tablet Take 400-600 mg by mouth every 8 (eight) hours as needed (She takes about 4-6 daily). For pain.      Marland Kitchen oxyCODONE-acetaminophen (PERCOCET) 5-325 MG per tablet Take 1 tablet by mouth every 4 (four) hours as needed. For pain.      Marland Kitchen  polyethylene glycol (MIRALAX / GLYCOLAX) packet Take 17 g by mouth daily as needed (Takes a half a capful daily). For constipation.      . promethazine (PHENERGAN) 25 MG tablet Take 25 mg by mouth every 6 (six) hours as needed for nausea.      . propranolol (INDERAL) 20 MG tablet Take 20 mg by mouth 2 (two) times daily.       . Wheat Dextrin (BENEFIBER PO) Take by mouth 3 (three) times daily.       No current facility-administered medications for this visit.    Allergies as of 07/30/2012 - Review Complete 07/30/2012  Allergen Reaction Noted   . Morphine Rash     ROS:  General: Positive for anorexia, weight loss. Denies fever, chills, fatigue, weakness. ENT: Negative for hoarseness, difficulty swallowing , nasal congestion. CV: Negative for chest pain, angina, palpitations, dyspnea on exertion, peripheral edema.  Respiratory: Negative for dyspnea at rest, dyspnea on exertion, cough, sputum, wheezing.  GI: See history of present illness. GU:  Negative for hematuria, urinary incontinence, nocturnal urination. C/O urinary frequency and chronic dysuria.  Endo: see hpi.   Physical Examination:   BP 120/79  Pulse 82  Temp(Src) 97.4 F (36.3 C) (Oral)  Ht 5\' 6"  (1.676 m)  Wt 131 lb 12.8 oz (59.784 kg)  BMI 21.28 kg/m2  General: Well-nourished, well-developed in no acute distress.  Eyes: No icterus. Mouth: Oropharyngeal mucosa moist and pink , no lesions erythema or exudate. Lungs: Clear to auscultation bilaterally.  Heart: Regular rate and rhythm, no murmurs rubs or gallops.  Abdomen: Bowel sounds are normal, diffuse mild tenderness, nondistended, no hepatosplenomegaly or masses, no abdominal bruits or hernia , no rebound or guarding.   Extremities: No lower extremity edema. No clubbing or deformities. Neuro: Alert and oriented x 4   Skin: Warm and dry, no jaundice.   Psych: Alert and cooperative, normal mood and affect.  Labs:  Lab Results  Component Value Date   WBC 4.3 05/12/2012   HGB 12.0 05/12/2012   HCT 36 05/12/2012   MCV 85.7 12/30/2011   PLT 323 12/30/2011   Lab Results  Component Value Date   CREATININE 0.62 05/12/2012   BUN 13 05/12/2012   NA 139 05/12/2012   K 4.3 05/12/2012   CL 103 05/12/2012   CO2 25 05/12/2012   Lab Results  Component Value Date   ALT 12 05/12/2012   AST 14 05/12/2012   ALKPHOS 60 12/21/2011   BILITOT 0.4 05/12/2012   Cortisol level 20.1 and February 2014

## 2012-07-30 NOTE — Progress Notes (Signed)
Quick Note:  Labs normal. Await GES. ______

## 2012-07-30 NOTE — Assessment & Plan Note (Signed)
Doing well on Benefiber and half capful of MiraLax daily.

## 2012-07-30 NOTE — Progress Notes (Signed)
Quick Note:  Pt is aware. GES is scheduled for 08/06/12 ______

## 2012-08-06 ENCOUNTER — Encounter (HOSPITAL_COMMUNITY)
Admission: RE | Admit: 2012-08-06 | Discharge: 2012-08-06 | Disposition: A | Discharge status: Home / Self Care | Payer: BC Managed Care – PPO | Source: Ambulatory Visit | Attending: Gastroenterology | Admitting: Gastroenterology

## 2012-08-06 ENCOUNTER — Encounter (HOSPITAL_COMMUNITY): Payer: Self-pay

## 2012-08-06 DIAGNOSIS — R1013 Epigastric pain: Secondary | ICD-10-CM

## 2012-08-06 DIAGNOSIS — R112 Nausea with vomiting, unspecified: Secondary | ICD-10-CM | POA: Insufficient documentation

## 2012-08-06 DIAGNOSIS — R634 Abnormal weight loss: Secondary | ICD-10-CM

## 2012-08-06 HISTORY — DX: Systemic involvement of connective tissue, unspecified: M35.9

## 2012-08-06 HISTORY — DX: Type 2 diabetes mellitus without complications: E11.9

## 2012-08-06 MED ORDER — TECHNETIUM TC 99M SULFUR COLLOID
2.0000 | Freq: Once | INTRAVENOUS | Status: AC | PRN
  Administered 2012-08-06: 2 via ORAL

## 2012-08-15 NOTE — Progress Notes (Signed)
Quick Note:  Her GES is normal. Weight loss, N/V remain unexplained. She previously had normal fasting AM cortisol level too.  Is she any better on Nexium BID? ______

## 2012-08-15 NOTE — Progress Notes (Signed)
Quick Note:  Pt is aware. She said she hasnt had any vomiting in the last 2 days. She is not sure if she is feeling better or not. Still has some nausea. ______

## 2012-08-15 NOTE — Progress Notes (Signed)
Quick Note:  Tried to call pt- LMOM ______ 

## 2012-08-29 NOTE — Progress Notes (Signed)
Quick Note:  Please find out of patient is feeling better on Nexium BID.  She needs f/u with RMR in July for weight loss, N/V. ______

## 2012-09-04 NOTE — Progress Notes (Signed)
Quick Note:  Tried to call pt- LMOM ______ 

## 2012-09-05 ENCOUNTER — Other Ambulatory Visit: Payer: Self-pay | Admitting: Internal Medicine

## 2012-09-06 ENCOUNTER — Encounter: Payer: Self-pay | Admitting: Internal Medicine

## 2012-09-06 NOTE — Progress Notes (Signed)
Quick Note:  Tried to call pt- LMOM ______ 

## 2012-09-11 ENCOUNTER — Other Ambulatory Visit: Payer: Self-pay

## 2012-09-12 MED ORDER — PROMETHAZINE HCL 25 MG PO TABS: ORAL_TABLET | ORAL | Status: DC

## 2012-10-26 ENCOUNTER — Ambulatory Visit: Payer: BC Managed Care – PPO | Admitting: Internal Medicine

## 2012-11-23 ENCOUNTER — Ambulatory Visit: Payer: BC Managed Care – PPO | Admitting: Internal Medicine

## 2012-11-30 ENCOUNTER — Ambulatory Visit: Payer: BC Managed Care – PPO | Admitting: Internal Medicine

## 2012-12-14 ENCOUNTER — Ambulatory Visit: Payer: BC Managed Care – PPO | Admitting: Internal Medicine

## 2013-01-03 ENCOUNTER — Ambulatory Visit: Payer: BC Managed Care – PPO | Admitting: Internal Medicine

## 2013-01-11 ENCOUNTER — Emergency Department (HOSPITAL_COMMUNITY): Payer: BC Managed Care – PPO

## 2013-01-11 ENCOUNTER — Inpatient Hospital Stay (HOSPITAL_COMMUNITY)
Admission: EM | Admit: 2013-01-11 | Discharge: 2013-01-13 | DRG: 089 | Disposition: A | Discharge status: Home / Self Care | Payer: BC Managed Care – PPO | Attending: Internal Medicine | Admitting: Internal Medicine

## 2013-01-11 ENCOUNTER — Encounter (HOSPITAL_COMMUNITY): Payer: Self-pay

## 2013-01-11 DIAGNOSIS — Z8249 Family history of ischemic heart disease and other diseases of the circulatory system: Secondary | ICD-10-CM

## 2013-01-11 DIAGNOSIS — F329 Major depressive disorder, single episode, unspecified: Secondary | ICD-10-CM | POA: Diagnosis present

## 2013-01-11 DIAGNOSIS — R110 Nausea: Secondary | ICD-10-CM

## 2013-01-11 DIAGNOSIS — Z8659 Personal history of other mental and behavioral disorders: Secondary | ICD-10-CM

## 2013-01-11 DIAGNOSIS — E860 Dehydration: Secondary | ICD-10-CM | POA: Diagnosis present

## 2013-01-11 DIAGNOSIS — F410 Panic disorder [episodic paroxysmal anxiety] without agoraphobia: Secondary | ICD-10-CM | POA: Diagnosis present

## 2013-01-11 DIAGNOSIS — K5900 Constipation, unspecified: Secondary | ICD-10-CM

## 2013-01-11 DIAGNOSIS — F411 Generalized anxiety disorder: Secondary | ICD-10-CM | POA: Diagnosis present

## 2013-01-11 DIAGNOSIS — R1013 Epigastric pain: Secondary | ICD-10-CM

## 2013-01-11 DIAGNOSIS — N3010 Interstitial cystitis (chronic) without hematuria: Secondary | ICD-10-CM | POA: Diagnosis present

## 2013-01-11 DIAGNOSIS — N200 Calculus of kidney: Secondary | ICD-10-CM

## 2013-01-11 DIAGNOSIS — Z833 Family history of diabetes mellitus: Secondary | ICD-10-CM

## 2013-01-11 DIAGNOSIS — K625 Hemorrhage of anus and rectum: Secondary | ICD-10-CM

## 2013-01-11 DIAGNOSIS — E876 Hypokalemia: Secondary | ICD-10-CM

## 2013-01-11 DIAGNOSIS — R933 Abnormal findings on diagnostic imaging of other parts of digestive tract: Secondary | ICD-10-CM

## 2013-01-11 DIAGNOSIS — F3289 Other specified depressive episodes: Secondary | ICD-10-CM | POA: Diagnosis present

## 2013-01-11 DIAGNOSIS — K219 Gastro-esophageal reflux disease without esophagitis: Secondary | ICD-10-CM

## 2013-01-11 DIAGNOSIS — I10 Essential (primary) hypertension: Secondary | ICD-10-CM | POA: Diagnosis present

## 2013-01-11 DIAGNOSIS — R634 Abnormal weight loss: Secondary | ICD-10-CM

## 2013-01-11 DIAGNOSIS — Z23 Encounter for immunization: Secondary | ICD-10-CM

## 2013-01-11 DIAGNOSIS — Z79899 Other long term (current) drug therapy: Secondary | ICD-10-CM

## 2013-01-11 DIAGNOSIS — K649 Unspecified hemorrhoids: Secondary | ICD-10-CM

## 2013-01-11 DIAGNOSIS — R160 Hepatomegaly, not elsewhere classified: Secondary | ICD-10-CM

## 2013-01-11 DIAGNOSIS — R112 Nausea with vomiting, unspecified: Secondary | ICD-10-CM

## 2013-01-11 DIAGNOSIS — N390 Urinary tract infection, site not specified: Secondary | ICD-10-CM

## 2013-01-11 DIAGNOSIS — M5030 Other cervical disc degeneration, unspecified cervical region: Secondary | ICD-10-CM | POA: Diagnosis present

## 2013-01-11 DIAGNOSIS — E119 Type 2 diabetes mellitus without complications: Secondary | ICD-10-CM | POA: Diagnosis present

## 2013-01-11 DIAGNOSIS — G43909 Migraine, unspecified, not intractable, without status migrainosus: Secondary | ICD-10-CM | POA: Diagnosis present

## 2013-01-11 DIAGNOSIS — R12 Heartburn: Secondary | ICD-10-CM

## 2013-01-11 DIAGNOSIS — J189 Pneumonia, unspecified organism: Principal | ICD-10-CM | POA: Diagnosis present

## 2013-01-11 DIAGNOSIS — Z800 Family history of malignant neoplasm of digestive organs: Secondary | ICD-10-CM

## 2013-01-11 DIAGNOSIS — K589 Irritable bowel syndrome without diarrhea: Secondary | ICD-10-CM | POA: Diagnosis present

## 2013-01-11 LAB — CBC WITH DIFFERENTIAL/PLATELET
Basophils Absolute: 0 10*3/uL (ref 0.0–0.1)
Eosinophils Relative: 6 % — ABNORMAL HIGH (ref 0–5)
HCT: 30.4 % — ABNORMAL LOW (ref 36.0–46.0)
Hemoglobin: 9.9 g/dL — ABNORMAL LOW (ref 12.0–15.0)
Lymphocytes Relative: 4 % — ABNORMAL LOW (ref 12–46)
MCHC: 32.6 g/dL (ref 30.0–36.0)
MCV: 87.6 fL (ref 78.0–100.0)
Monocytes Absolute: 0.3 10*3/uL (ref 0.1–1.0)
Monocytes Relative: 4 % (ref 3–12)
Neutro Abs: 6.9 10*3/uL (ref 1.7–7.7)
RDW: 14.4 % (ref 11.5–15.5)
WBC: 7.9 10*3/uL (ref 4.0–10.5)

## 2013-01-11 LAB — BASIC METABOLIC PANEL
BUN: 12 mg/dL (ref 6–23)
CO2: 23 mEq/L (ref 19–32)
Chloride: 97 mEq/L (ref 96–112)
Creatinine, Ser: 0.72 mg/dL (ref 0.50–1.10)
Potassium: 3.7 mEq/L (ref 3.5–5.1)

## 2013-01-11 LAB — URINALYSIS, ROUTINE W REFLEX MICROSCOPIC
Glucose, UA: NEGATIVE mg/dL
Nitrite: POSITIVE — AB
Specific Gravity, Urine: 1.025 (ref 1.005–1.030)
pH: 8 (ref 5.0–8.0)

## 2013-01-11 LAB — HIV ANTIBODY (ROUTINE TESTING W REFLEX): HIV: NONREACTIVE

## 2013-01-11 LAB — CG4 I-STAT (LACTIC ACID): Lactic Acid, Venous: 1.29 mmol/L (ref 0.5–2.2)

## 2013-01-11 LAB — URINE MICROSCOPIC-ADD ON

## 2013-01-11 MED ORDER — AMITRIPTYLINE HCL 25 MG PO TABS
25.0000 mg | ORAL_TABLET | Freq: Every day | ORAL | Status: DC
  Administered 2013-01-11 – 2013-01-12 (×2): 25 mg via ORAL
  Filled 2013-01-11 (×2): qty 1

## 2013-01-11 MED ORDER — SODIUM CHLORIDE 0.9 % IV BOLUS (SEPSIS)
1000.0000 mL | Freq: Once | INTRAVENOUS | Status: AC
  Administered 2013-01-11: 1000 mL via INTRAVENOUS

## 2013-01-11 MED ORDER — DEXTROSE 5 % IV SOLN
500.0000 mg | INTRAVENOUS | Status: DC
  Administered 2013-01-12 – 2013-01-13 (×2): 500 mg via INTRAVENOUS
  Filled 2013-01-11 (×3): qty 500

## 2013-01-11 MED ORDER — HEPARIN SODIUM (PORCINE) 5000 UNIT/ML IJ SOLN
5000.0000 [IU] | Freq: Three times a day (TID) | INTRAMUSCULAR | Status: DC
  Administered 2013-01-11 – 2013-01-13 (×6): 5000 [IU] via SUBCUTANEOUS
  Filled 2013-01-11 (×6): qty 1

## 2013-01-11 MED ORDER — DEXTROSE 5 % IV SOLN
1.0000 g | INTRAVENOUS | Status: DC
  Administered 2013-01-12 – 2013-01-13 (×2): 1 g via INTRAVENOUS
  Filled 2013-01-11 (×3): qty 10

## 2013-01-11 MED ORDER — HYDROMORPHONE HCL PF 1 MG/ML IJ SOLN
1.0000 mg | Freq: Once | INTRAMUSCULAR | Status: AC
  Administered 2013-01-11: 1 mg via INTRAVENOUS
  Filled 2013-01-11: qty 1

## 2013-01-11 MED ORDER — ACETAMINOPHEN 325 MG PO TABS
650.0000 mg | ORAL_TABLET | Freq: Once | ORAL | Status: AC
  Administered 2013-01-11: 650 mg via ORAL
  Filled 2013-01-11: qty 2

## 2013-01-11 MED ORDER — OXYBUTYNIN CHLORIDE 5 MG PO TABS: 5.0000 mg | ORAL_TABLET | Freq: Three times a day (TID) | ORAL | Status: DC | PRN

## 2013-01-11 MED ORDER — OXYCODONE-ACETAMINOPHEN 5-325 MG PO TABS
1.0000 | ORAL_TABLET | ORAL | Status: DC | PRN
  Administered 2013-01-11 – 2013-01-13 (×8): 1 via ORAL
  Filled 2013-01-11 (×8): qty 1

## 2013-01-11 MED ORDER — DM-GUAIFENESIN ER 30-600 MG PO TB12: 2.0000 | ORAL_TABLET | Freq: Two times a day (BID) | ORAL | Status: DC | PRN

## 2013-01-11 MED ORDER — POTASSIUM CHLORIDE ER 10 MEQ PO TBCR: 10.0000 meq | EXTENDED_RELEASE_TABLET | Freq: Two times a day (BID) | ORAL | Status: DC

## 2013-01-11 MED ORDER — SENNOSIDES-DOCUSATE SODIUM 8.6-50 MG PO TABS
1.0000 | ORAL_TABLET | Freq: Every day | ORAL | Status: DC
  Administered 2013-01-11 – 2013-01-13 (×3): 1 via ORAL
  Filled 2013-01-11 (×3): qty 1

## 2013-01-11 MED ORDER — POTASSIUM CHLORIDE IN NACL 20-0.9 MEQ/L-% IV SOLN
INTRAVENOUS | Status: DC
  Administered 2013-01-11 – 2013-01-12 (×4): via INTRAVENOUS

## 2013-01-11 MED ORDER — CEFTRIAXONE SODIUM 1 G IJ SOLR: 1.0000 g | INTRAMUSCULAR | Status: DC

## 2013-01-11 MED ORDER — IBUPROFEN 400 MG PO TABS
400.0000 mg | ORAL_TABLET | Freq: Three times a day (TID) | ORAL | Status: DC | PRN
  Administered 2013-01-11: 400 mg via ORAL
  Filled 2013-01-11: qty 1

## 2013-01-11 MED ORDER — GABAPENTIN 300 MG PO CAPS
300.0000 mg | ORAL_CAPSULE | Freq: Three times a day (TID) | ORAL | Status: DC
  Administered 2013-01-11 – 2013-01-13 (×7): 300 mg via ORAL
  Filled 2013-01-11 (×7): qty 1

## 2013-01-11 MED ORDER — ALPRAZOLAM 0.5 MG PO TABS: 0.5000 mg | ORAL_TABLET | Freq: Three times a day (TID) | ORAL | Status: DC | PRN

## 2013-01-11 MED ORDER — PENTOSAN POLYSULFATE SODIUM 100 MG PO CAPS
100.0000 mg | ORAL_CAPSULE | Freq: Two times a day (BID) | ORAL | Status: DC
  Administered 2013-01-11 – 2013-01-13 (×5): 100 mg via ORAL
  Filled 2013-01-11 (×7): qty 1

## 2013-01-11 MED ORDER — DEXTROSE 5 % IV SOLN
500.0000 mg | Freq: Once | INTRAVENOUS | Status: AC
  Administered 2013-01-11: 500 mg via INTRAVENOUS
  Filled 2013-01-11: qty 500

## 2013-01-11 MED ORDER — DEXTROSE 5 % IV SOLN
1.0000 g | Freq: Once | INTRAVENOUS | Status: AC
  Administered 2013-01-11: 1 g via INTRAVENOUS
  Filled 2013-01-11: qty 10

## 2013-01-11 MED ORDER — DEXTROSE 5 % IV SOLN: 500.0000 mg | INTRAVENOUS | Status: DC

## 2013-01-11 MED ORDER — INFLUENZA VAC SPLIT QUAD 0.5 ML IM SUSP
0.5000 mL | INTRAMUSCULAR | Status: AC
  Administered 2013-01-12: 0.5 mL via INTRAMUSCULAR
  Filled 2013-01-11: qty 0.5

## 2013-01-11 MED ORDER — ONDANSETRON HCL 4 MG/2ML IJ SOLN
4.0000 mg | Freq: Once | INTRAMUSCULAR | Status: AC
  Administered 2013-01-11: 4 mg via INTRAVENOUS
  Filled 2013-01-11: qty 2

## 2013-01-11 MED ORDER — PROMETHAZINE HCL 12.5 MG PO TABS
25.0000 mg | ORAL_TABLET | Freq: Four times a day (QID) | ORAL | Status: DC | PRN
  Administered 2013-01-12 (×2): 25 mg via ORAL
  Filled 2013-01-11 (×2): qty 2

## 2013-01-11 MED ORDER — HYOSCYAMINE SULFATE 0.125 MG SL SUBL: 0.1250 mg | SUBLINGUAL_TABLET | SUBLINGUAL | Status: DC | PRN

## 2013-01-11 MED ORDER — PHENYLEPHRINE-DM-GG 2.5-5-100 MG/5ML PO LIQD: 30.0000 mL | Freq: Four times a day (QID) | ORAL | Status: DC | PRN

## 2013-01-11 MED ORDER — HYDROMORPHONE HCL PF 1 MG/ML IJ SOLN
0.5000 mg | Freq: Once | INTRAMUSCULAR | Status: AC
  Administered 2013-01-11: 0.5 mg via INTRAVENOUS
  Filled 2013-01-11: qty 1

## 2013-01-11 MED ORDER — PROPRANOLOL HCL 20 MG PO TABS
20.0000 mg | ORAL_TABLET | Freq: Two times a day (BID) | ORAL | Status: DC
  Administered 2013-01-11 – 2013-01-13 (×4): 20 mg via ORAL
  Filled 2013-01-11 (×5): qty 1

## 2013-01-11 MED ORDER — PANTOPRAZOLE SODIUM 40 MG PO TBEC
40.0000 mg | DELAYED_RELEASE_TABLET | Freq: Every day | ORAL | Status: DC
  Administered 2013-01-11 – 2013-01-13 (×3): 40 mg via ORAL
  Filled 2013-01-11 (×3): qty 1

## 2013-01-11 NOTE — Progress Notes (Signed)
UR Chart Review Completed  

## 2013-01-11 NOTE — H&P (Addendum)
[facility] History and Physical  SUTTYN CRYDER ZOX:[id number] DOB: [birth year] DOA: 01/11/2013  Referring physician: Dr. Bebe Shaggy, ER. PCP: Cassell Smiles., MD   Chief Complaint: Fever.  HPI: Colleen Duncan is a 42 y.o. female who presents with a two-day history of fever associated with dry cough and increasing dyspnea. She had cystoscopy done at [facility] approximately a week ago for a second opinion for her bladder problems. The diagnoses of interstitial cystitis, which was made previously was confirmed by the physician at [facility]. Since this procedure, she has had lower abdominal discomfort with some nausea. However, the fever was a new symptom which started 2 days ago as mentioned above. When she was evaluated in the emergency room, chest x-ray showed impressive pneumonia on the right side. She is now being admitted for treatment of this. Urine also is suggestive of UTI.   Review of Systems:  Apart from the symptoms mentioned above, other systems are negative.  Past Medical History  Diagnosis Date  . PONV (postoperative nausea and vomiting)   . Hypertension     on inderal-instructed to take dos  . Seasonal allergies   . GERD (gastroesophageal reflux disease)     nexium-instructed to take dos  . Depression   . Irritable bowel   . Interstitial cystitis   . Migraine     migraines-treated with inderal along with treatment for htn  . Chronic neck pain   . DDD (degenerative disc disease), cervical   . Liver nodule STABLE BENIGN FOCAL HYPERPLASIA NODULAR LEFT HEPATIC LOBE    PER MRI  01-17-2012, last MRI recommended in April 2015, is stable no further workup will be needed   . History of head injury 2012-- LOC BUT NO RESIDUALS (PT RACES GO-CARTS)  . Frequency of urination   . Urgency of urination   . Nocturia   . Right arm weakness SECONARDAY TO CERVICAL FUSION  . Anxiety   . Anxiety attack   . Hemorrhoids   . Collagen vascular disease   . Diabetes mellitus without  complication    Past Surgical History  Procedure Laterality Date  . Knee arthroscopy  1992  &  1988    bilateral  . Ectopic pregnancy surgery  1993  . Tonsillectomy    . Laparoscopy  01/04/2011    Procedure: LAPAROSCOPY DIAGNOSTIC;  Surgeon: Melony Overly;  Location: [facility];  Service: Gynecology;  Laterality: N/A;  Bilateral salpingoectomy/ LYSIS ADHESIONS  . Cysto/  hod  01-26-2010 ;  09-01-2003 ;   DR NESI    CHRONIC I.C.  . Anterior cervical decomp/discectomy fusion  04-25-2005    C6 - 7  . Right ureteroscopic stone extraction  05-31-2002  . Laparoscopic cholecystectomy  11-02-2001  . Vaginal hysterectomy  07-05-2001  . Cysto with hydrodistension  01/20/2012    Procedure: CYSTOSCOPY/HYDRODISTENSION;  Surgeon: Lindaann Slough, MD;  Location: [facility];  Service: Urology;  Laterality: N/A;  Bladder instillation of Marcaine and Pyridium   . Esophagogastroduodenoscopy  02/15/2007    Dr. Jena Gauss- noncritical subtle schatzki's ring, o/w normal. tiny hiatal hernia o/w normal stomach  . Esophagogastroduodenoscopy  06/18/2007    Dr. Elmer Ramp esophagus, stomach, D1 and D2  . Bravo ph study  06/18/2007    Dr. Jena Gauss- study indicates extremely good control of gastroesophageal reflux while on acid supppression therapy.  . Esophagogastroduodenoscopy (egd) with esophageal dilation  05/07/2012    Rourk-small HH, benign esophageal bx, Maloney dilation  . Colonoscopy N/A 06/18/2012    [id number]  anal canal hemorrhoid/Tiny colonic ulcers (benign likely related to aspirin plus or minus Elmiron)   Social History:  reports that she has never smoked. She has never used smokeless tobacco. She reports that she does not drink alcohol or use illicit drugs.   Allergies  Allergen Reactions  . Morphine Rash    Family History  Problem Relation Age of Onset  . Pancreatic cancer Maternal Grandmother   . Diabetes Mother   . Heart disease Father   . Diabetes Sister       Prior to  Admission medications   Medication Sig Start Date End Date Taking? Authorizing Provider  ALPRAZolam Prudy Feeler) 0.5 MG tablet Take 0.5 mg by mouth 3 (three) times daily as needed for sleep.   Yes Historical Provider, MD  amitriptyline (ELAVIL) 25 MG tablet Take 25 mg by mouth at bedtime.   Yes Historical Provider, MD  esomeprazole (NEXIUM) 40 MG capsule Take 1 capsule (40 mg total) by mouth 2 (two) times daily before a meal. 07/30/12  Yes Tiffany Kocher, PA-C  gabapentin (NEURONTIN) 300 MG capsule Take 300 mg by mouth 3 (three) times daily.   Yes Historical Provider, MD  hyoscyamine (LEVSIN SL) 0.125 MG SL tablet Place 0.125 mg under the tongue every 4 (four) hours as needed for cramping.    Yes Historical Provider, MD  ibuprofen (ADVIL,MOTRIN) 200 MG tablet Take 400-600 mg by mouth every 8 (eight) hours as needed (She takes about 4-6 daily). For pain.   Yes Historical Provider, MD  oxybutynin (DITROPAN) 5 MG tablet Take 5 mg by mouth 3 (three) times daily as needed. Take 1 tablet (5 mg total) by mouth 3 (three) times daily as needed for up to 5 days (bladder spasms). 01/07/13 01/12/13 Yes Historical Provider, MD  oxyCODONE-acetaminophen (PERCOCET) 5-325 MG per tablet Take 1 tablet by mouth every 4 (four) hours as needed. For pain.   Yes Historical Provider, MD  pentosan polysulfate (ELMIRON) 100 MG capsule Take 100 mg by mouth 2 (two) times daily.   Yes Historical Provider, MD  Phenylephrine-DM-GG (MUCINEX FAST-MAX CONGEST COUGH) 2.5-5-100 MG/5ML LIQD Take 30 mLs by mouth every 6 (six) hours as needed (congestion/cough).   Yes Historical Provider, MD  potassium chloride (K-DUR) 10 MEQ tablet Take 10 mEq by mouth 2 (two) times daily.   Yes Historical Provider, MD  PRESCRIPTION MEDICATION Irrigate with 15 mLs as directed daily as needed (bladder pain). Each 15ml includes: 50,000 units heparin/ 200mg  lidocaine.  PATIENT GETS PREMIXED FROM [facility] [place] (PH# [phone]).   Yes  Historical Provider, MD  promethazine (PHENERGAN) 25 MG tablet Take 25 mg by mouth every 6 (six) hours as needed for nausea.   Yes Historical Provider, MD  propranolol (INDERAL) 20 MG tablet Take 20 mg by mouth 2 (two) times daily.    Yes Historical Provider, MD  sennosides-docusate sodium (SENOKOT-S) 8.6-50 MG tablet Take 1 tablet by mouth daily. Take 1 tablet by mouth daily for 30 days. 01/07/13 02/06/13 Yes Historical Provider, MD  sulfamethoxazole-trimethoprim (BACTRIM DS) 800-160 MG per tablet Take 1 tablet by mouth 2 (two) times daily. Started 01/07/2013 and finished on 01/10/2013.    Historical Provider, MD   Physical Exam: Filed Vitals:   01/11/13 1111  BP: 93/62  Pulse: 105  Temp: 98.1 F (36.7 C)  Resp:      General:  She looks clinically dehydrated. She is not toxic or septic clinically.  Eyes: No pallor. No jaundice.  ENT: Unremarkable.  Neck: No  lymphadenopathy.  Cardiovascular: Heart sounds are present without murmurs or added sounds.  Respiratory: Lung fields are reduced on the right mid and lower zones. There is dullness to percussion on this side. There is no wheezing. There is no bronchial breathing.  Abdomen: Soft but tender in a generalized fashion the lower abdomen. Bowel sounds are heard and are normal. There are no masses felt.  Skin: No rash.  Musculoskeletal: No acute joint problems.  Psychiatric: Appropriate affect.  Neurologic: Alert and orientated without any focal neurological signs.  Labs on Admission:  Basic Metabolic Panel:  Recent Labs Lab 01/11/13 0741  NA 133*  K 3.7  CL 97  CO2 23  GLUCOSE 130*  BUN 12  CREATININE 0.72  CALCIUM 9.0       CBC:  Recent Labs Lab 01/11/13 0741  WBC 7.9  NEUTROABS 6.9  HGB 9.9*  HCT 30.4*  MCV 87.6  PLT 231      Radiological Exams on Admission: Dg Chest 2 View  01/11/2013   CLINICAL DATA:  Fever  EXAM: CHEST  2 VIEW  COMPARISON:  April 22, 2005  FINDINGS: There is extensive  airspace consolidation involving portions of the posterior segment right upper lobe as well as much of the right lower lobe. Left lung is clear. Heart size and pulmonary vascularity are normal. No adenopathy. There is postoperative change in the lower cervical spine.  IMPRESSION: There is infiltrate throughout much of the right lower lobe as well as a portion of the posterior segment of the right upper lobe. Left lung clear.   Electronically Signed   By: Bretta Bang M.D.   On: 01/11/2013 08:25      Assessment/Plan   1. Right-sided community-acquired pneumonia. 2. Urine tract infection in the setting of interstitial cystitis. 3. Hypertension. 4. Dehydration.  Plan: 1. Admit to medical floor. 2. Intravenous antibiotics for community-acquired pneumonia and UTI. 3. Intravenous fluids for dehydration.  Further recommendations will depend on patient's hospital progress.   Code Status: Full code.  Family Communication: Discussed plan with patient at the bedside.  Disposition Plan: Home when medically stable.   Time spent: 30 minutes.  Wilson Singer [facility] Pager [phone].  If 7PM-7AM, please contact night-coverage [email or web address] Password [email or web address] 01/11/2013, 11:42 AM

## 2013-01-11 NOTE — ED Notes (Signed)
Called to treatment area. Pt in bathroom

## 2013-01-11 NOTE — ED Notes (Signed)
Pt states she had bladder surgery Monday. States she has been having pain, swelling, fever and vomiting. Pt states she had been self cathing prior to surgery. Now there are large clots when she cath's and has decreased urine output

## 2013-01-11 NOTE — ED Notes (Signed)
MD at bedside. 

## 2013-01-11 NOTE — ED Provider Notes (Signed)
CSN: [id number]     Arrival date & time 01/11/13  0712 History   This chart was scribed for Joya Gaskins, MD by Bennett Scrape, ED Scribe. This patient was seen in room APA11/APA11 and the patient's care was started at 7:26 AM.   Chief Complaint  Patient presents with  . Fever    Patient is a 42 y.o. female presenting with fever. The history is provided by the patient. No language interpreter was used.  Fever Max temp prior to arrival:  103.8 Severity:  Moderate Timing:  Constant Progression:  Unchanged Chronicity:  New Ineffective treatments:  Ibuprofen Associated symptoms: cough, diarrhea (chronic, no changes ), headaches, nausea and vomiting   Associated symptoms: no chest pain and no rash   Risk factors: recent surgery   Risk factors: no sick contacts     HPI Comments: DANIKA KLUENDER is a 42 y.o. female who presents to the Emergency Department complaining of persistent fever of 103.8 with associated nausea and emesis that woke her from sleep this morning. Fever is 100.4 in the ED. She had an out-patient scope procedure of her bladder 4 days ago at [facility]. No biopsy was done and she states that she was under general anesthesia for the surgery. She reports that she had severe lower abdominal pain after the surgery that has been persistent since and developed mild swelling with increased warmth to the lower abdomen yesterday. She denies that the pain has increased since the surgery but states that it has changed in quality. She took 2 Advil this morning for her symptoms with no improvement and called her surgeon. She was advised to come to the nearest ED for work-up. She also reports a recent HA and cough but denies any new diarrhea, rash or vaginal discharge. She has a h/o diarrhea due to IBS. She is unsure of vaginal bleeding or hematuria due to a medication that she is on that discolors her urine. She reports that she finished her Bactrim prescription yesterday. She has a h/o partial  hysterectomy, ectopic pregnancy, appendectomy, cholecystectomy and oophorectomy due to endometriosis damage per pt.    Past Medical History  Diagnosis Date  . PONV (postoperative nausea and vomiting)   . Hypertension     on inderal-instructed to take dos  . Seasonal allergies   . GERD (gastroesophageal reflux disease)     nexium-instructed to take dos  . Depression   . Irritable bowel   . Interstitial cystitis   . Migraine     migraines-treated with inderal along with treatment for htn  . Chronic neck pain   . DDD (degenerative disc disease), cervical   . Liver nodule STABLE BENIGN FOCAL HYPERPLASIA NODULAR LEFT HEPATIC LOBE    PER MRI  01-17-2012, last MRI recommended in April 2015, is stable no further workup will be needed   . History of head injury 2012-- LOC BUT NO RESIDUALS (PT RACES GO-CARTS)  . Frequency of urination   . Urgency of urination   . Nocturia   . Right arm weakness SECONARDAY TO CERVICAL FUSION  . Anxiety   . Anxiety attack   . Hemorrhoids   . Collagen vascular disease   . Diabetes mellitus without complication    Past Surgical History  Procedure Laterality Date  . Knee arthroscopy  1992  &  1988    bilateral  . Ectopic pregnancy surgery  1993  . Tonsillectomy    . Laparoscopy  01/04/2011    Procedure: LAPAROSCOPY DIAGNOSTIC;  Surgeon:  Brook Ananias Pilgrim;  Location: [facility];  Service: Gynecology;  Laterality: N/A;  Bilateral salpingoectomy/ LYSIS ADHESIONS  . Cysto/  hod  01-26-2010 ;  09-01-2003 ;   DR NESI    CHRONIC I.C.  . Anterior cervical decomp/discectomy fusion  04-25-2005    C6 - 7  . Right ureteroscopic stone extraction  05-31-2002  . Laparoscopic cholecystectomy  11-02-2001  . Vaginal hysterectomy  07-05-2001  . Cysto with hydrodistension  01/20/2012    Procedure: CYSTOSCOPY/HYDRODISTENSION;  Surgeon: Lindaann Slough, MD;  Location: [facility];  Service: Urology;  Laterality: N/A;  Bladder instillation of Marcaine and Pyridium    . Esophagogastroduodenoscopy  02/15/2007    Dr. Jena Gauss- noncritical subtle schatzki's ring, o/w normal. tiny hiatal hernia o/w normal stomach  . Esophagogastroduodenoscopy  06/18/2007    Dr. Elmer Ramp esophagus, stomach, D1 and D2  . Bravo ph study  06/18/2007    Dr. Jena Gauss- study indicates extremely good control of gastroesophageal reflux while on acid supppression therapy.  . Esophagogastroduodenoscopy (egd) with esophageal dilation  05/07/2012    Rourk-small HH, benign esophageal bx, Maloney dilation  . Colonoscopy N/A 06/18/2012    [id number] anal canal hemorrhoid/Tiny colonic ulcers (benign likely related to aspirin plus or minus Elmiron)   Family History  Problem Relation Age of Onset  . Pancreatic cancer Maternal Grandmother   . Diabetes Mother   . Heart disease Father   . Diabetes Sister    History  Substance Use Topics  . Smoking status: Never Smoker   . Smokeless tobacco: Never Used  . Alcohol Use: No   No OB history provided.   Review of Systems  Constitutional: Positive for fever.  Respiratory: Positive for cough.   Cardiovascular: Negative for chest pain.  Gastrointestinal: Positive for nausea, vomiting, abdominal pain and diarrhea (chronic, no changes ). Negative for blood in stool.  Genitourinary: Negative for vaginal discharge.  Skin: Negative for rash.  Neurological: Positive for headaches.  All other systems reviewed and are negative.    Allergies  Morphine  Home Medications   Current Outpatient Rx  Name  Route  Sig  Dispense  Refill  . ALPRAZolam (XANAX) 0.25 MG tablet   Oral   Take 0.25 mg by mouth at bedtime as needed.          Marland Kitchen amitriptyline (ELAVIL) 10 MG tablet   Oral   Take 10 mg by mouth at bedtime.         Marland Kitchen amLODipine (NORVASC) 2.5 MG tablet   Oral   Take 2.5 mg by mouth daily.          . diphenhydrAMINE (BENADRYL) 25 mg capsule   Oral   Take 25 mg by mouth at bedtime as needed. For allergies         . esomeprazole  (NEXIUM) 40 MG capsule   Oral   Take 1 capsule (40 mg total) by mouth 2 (two) times daily before a meal.   60 capsule   11   . hyoscyamine (LEVSIN SL) 0.125 MG SL tablet   Sublingual   Place 0.125 mg under the tongue every 4 (four) hours as needed for cramping.          Marland Kitchen ibuprofen (ADVIL,MOTRIN) 200 MG tablet   Oral   Take 400-600 mg by mouth every 8 (eight) hours as needed (She takes about 4-6 daily). For pain.         Marland Kitchen oxyCODONE-acetaminophen (PERCOCET) 5-325 MG per tablet   Oral  Take 1 tablet by mouth every 4 (four) hours as needed. For pain.         . polyethylene glycol (MIRALAX / GLYCOLAX) packet   Oral   Take 17 g by mouth daily as needed (Takes a half a capful daily). For constipation.         . promethazine (PHENERGAN) 25 MG tablet      TAKE 1 TABLET EVERY 8 HOURS AS NEEDED FOR NAUSEA AND VOMITING.   30 tablet   0   . propranolol (INDERAL) 20 MG tablet   Oral   Take 20 mg by mouth 2 (two) times daily.          . Wheat Dextrin (BENEFIBER PO)   Oral   Take by mouth 3 (three) times daily.          Triage Vitals: BP 131/68  Pulse 110  Temp(Src) 100.4 F (38 C) (Oral)  Resp 20  Ht 5\' 5"  (1.651 m)  Wt 139 lb (63.05 kg)  BMI 23.13 kg/m2  SpO2 95%  Physical Exam  Nursing note and vitals reviewed.  CONSTITUTIONAL: Well developed/well nourished HEAD: Normocephalic/atraumatic EYES: EOMI/PERRL ENMT: Mucous membranes dry NECK: supple no meningeal signs SPINE:entire spine nontender CV: S1/S2 noted, no murmurs/rubs/gallops noted LUNGS: Lungs are clear to auscultation bilaterally, no apparent distress ABDOMEN: soft, moderate tenderness to the lower abdomen, no rebound or guarding GU:no cva tenderness NEURO: Pt is awake/alert, moves all extremitiesx4 EXTREMITIES: pulses normal, full ROM SKIN: warm, color normal PSYCH: no abnormalities of mood noted  ED Course  Procedures (including critical care time)  Medications  HYDROmorphone (DILAUDID)  injection 1 mg (not administered)  ondansetron (ZOFRAN) injection 4 mg (not administered)  sodium chloride 0.9 % bolus 1,000 mL (not administered)  acetaminophen (TYLENOL) tablet 650 mg (not administered)    DIAGNOSTIC STUDIES: Oxygen Saturation is 95% on room air, normal by my interpretation.    COORDINATION OF CARE: 7:36 AM-Discussed treatment plan which includes medications, CXR, CBC panel, BMP and UA with pt at bedside and pt agreed to plan.  8:07 AM Per records at [facility], pt had cystoscopy with hydrodistention by urology on 9/29 by dr Logan Bores 9:31 AM Extensive pneumonia noted Pt admits her abdominal pain now is similar to pain even before procedure.   On repeat exam abdomen is soft and not a surgical abdomen Her lung exam on repeat exam reveals crackles in right base D/w dr Karilyn Cota, will admit for pneumonia Called to dr Logan Bores her urologist has been placed  MDM  No diagnosis found. Nursing notes including past medical history and social history reviewed and considered in documentation xrays reviewed and considered Labs/vital reviewed and considered   I personally performed the services described in this documentation, which was scribed in my presence. The recorded information has been reviewed and is accurate.      Joya Gaskins, MD 01/11/13 [phone]

## 2013-01-11 NOTE — Progress Notes (Signed)
Antibiotic doses reviewed for renal function; She was started on Rocephin & Zithromax for PNA, UTI.  Neither antibiotic requires renal dose adjustments.  Doses ordered are appropriate & her renal function is excellent.  Pharmacy will sign off.  Thank you. Junita Push, PharmD, BCPS

## 2013-01-12 ENCOUNTER — Inpatient Hospital Stay (HOSPITAL_COMMUNITY): Payer: BC Managed Care – PPO

## 2013-01-12 DIAGNOSIS — I10 Essential (primary) hypertension: Secondary | ICD-10-CM

## 2013-01-12 LAB — COMPREHENSIVE METABOLIC PANEL
ALT: 7 U/L (ref 0–35)
AST: 12 U/L (ref 0–37)
CO2: 23 mEq/L (ref 19–32)
Calcium: 8.1 mg/dL — ABNORMAL LOW (ref 8.4–10.5)
Creatinine, Ser: 0.6 mg/dL (ref 0.50–1.10)
GFR calc Af Amer: >90 mL/min (ref >90)
GFR calc non Af Amer: >90 mL/min (ref >90)
Glucose, Bld: 103 mg/dL — ABNORMAL HIGH (ref 70–99)
Sodium: 135 mEq/L (ref 135–145)
Total Bilirubin: 0.2 mg/dL — ABNORMAL LOW (ref 0.3–1.2)

## 2013-01-12 LAB — EXPECTORATED SPUTUM ASSESSMENT W GRAM STAIN, RFLX TO RESP C: Special Requests: NORMAL

## 2013-01-12 LAB — CBC
Hemoglobin: 7.9 g/dL — ABNORMAL LOW (ref 12.0–15.0)
MCH: 27.9 pg (ref 26.0–34.0)
MCHC: 31.3 g/dL (ref 30.0–36.0)
MCV: 89 fL (ref 78.0–100.0)
Platelets: 218 10*3/uL (ref 150–400)
RDW: 14.6 % (ref 11.5–15.5)

## 2013-01-12 LAB — URINE CULTURE: Colony Count: 8000

## 2013-01-12 LAB — LEGIONELLA ANTIGEN, URINE: Legionella Antigen, Urine: NEGATIVE

## 2013-01-12 LAB — EXPECTORATED SPUTUM ASSESSMENT W REFEX TO RESP CULTURE

## 2013-01-12 MED ORDER — GUAIFENESIN 100 MG/5ML PO SOLN
200.0000 mg | ORAL | Status: DC | PRN
  Administered 2013-01-12 (×2): 200 mg via ORAL
  Filled 2013-01-12 (×2): qty 10

## 2013-01-12 NOTE — Progress Notes (Signed)
Colleen Duncan UJW:[id number] DOB: 1970/07/27 DOA: 01/11/2013 PCP: Cassell Smiles., MD   Subjective: This lady does feel better than when she came in. She is now coughing up yellow sputum. Her breathing is still somewhat harder when she moves. Her abdominal pain is improved and she is able to micturate without problems.           Physical Exam: Blood pressure 107/70, pulse 96, temperature 99.2 F (37.3 C), temperature source Oral, resp. rate 20, height 5\' 5"  (1.651 m), weight 63.05 kg (139 lb), SpO2 95.00%. She looks much better hydrated. There is no respiratory distress at rest. Her peripheries are warm and well-perfused. Lung fields are completely clear. She is alert and oriented.   Investigations:  Recent Results (from the past 240 hour(s))  CULTURE, BLOOD (ROUTINE X 2)     Status: None   Collection Time    01/11/13 12:07 PM      Result Value Range Status   Specimen Description Blood   Final   Special Requests Normal   Final   Culture NO GROWTH <24 HRS   Final   Report Status PENDING   Incomplete  CULTURE, BLOOD (ROUTINE X 2)     Status: None   Collection Time    01/11/13  1:00 PM      Result Value Range Status   Specimen Description BLOOD LEFT ARM   Final   Special Requests BOTTLES DRAWN AEROBIC ONLY 6CC   Final   Culture NO GROWTH <24 HRS   Final   Report Status PENDING   Incomplete     Basic Metabolic Panel:  Recent Labs  56/21/30 0741 01/12/13 0628  NA 133* 135  K 3.7 3.9  CL 97 104  CO2 23 23  GLUCOSE 130* 103*  BUN 12 6  CREATININE 0.72 0.60  CALCIUM 9.0 8.1*   Liver Function Tests:  Recent Labs  01/12/13 0628  AST 12  ALT 7  ALKPHOS 56  BILITOT 0.2*  PROT 5.5*  ALBUMIN 2.6*     CBC:  Recent Labs  01/11/13 0741 01/12/13 0628  WBC 7.9 6.5  NEUTROABS 6.9  --   HGB 9.9* 7.9*  HCT 30.4* 25.2*  MCV 87.6 89.0  PLT 231 218    Dg Chest 2 View  01/11/2013   CLINICAL DATA:  Fever  EXAM: CHEST  2 VIEW  COMPARISON:  April 22, 2005   FINDINGS: There is extensive airspace consolidation involving portions of the posterior segment right upper lobe as well as much of the right lower lobe. Left lung is clear. Heart size and pulmonary vascularity are normal. No adenopathy. There is postoperative change in the lower cervical spine.  IMPRESSION: There is infiltrate throughout much of the right lower lobe as well as a portion of the posterior segment of the right upper lobe. Left lung clear.   Electronically Signed   By: Bretta Bang M.D.   On: 01/11/2013 08:25      Medications: I have reviewed the patient's current medications.  Impression: 1. Right-sided community-acquired pneumonia, suspect atypical due  to lack of clinical findings and impressive chest x-ray. 2. UTI. 3. Interstitial cystitis. 4. Hypertension. 5. Reduced hemoglobin. This could be dilutional. There is no obvious bleeding.     Plan: 1. Reduce IV fluids. 2. Continue with intravenous antibiotics. 3. Mobilize.  Consultants:   none.   Procedures:  None.   Antibiotics:  Intravenous Rocephin started 01/11/2013.  Intravenous azithromycin started 01/11/2013.  Code Status: Full code.  Family Communication: Discussed plan with patient at the bedside.   Disposition Plan: Home when medically stable.  Time spent: 15 minutes.   LOS: 1 day   Wilson Singer Pager [phone]  01/12/2013, 8:15 AM

## 2013-01-13 DIAGNOSIS — R1013 Epigastric pain: Secondary | ICD-10-CM

## 2013-01-13 LAB — CBC
HCT: 27.6 % — ABNORMAL LOW (ref 36.0–46.0)
Hemoglobin: 8.6 g/dL — ABNORMAL LOW (ref 12.0–15.0)
MCH: 28 pg (ref 26.0–34.0)
MCV: 89.9 fL (ref 78.0–100.0)
RBC: 3.07 MIL/uL — ABNORMAL LOW (ref 3.87–5.11)
RDW: 14.7 % (ref 11.5–15.5)
WBC: 6 10*3/uL (ref 4.0–10.5)

## 2013-01-13 LAB — BASIC METABOLIC PANEL
BUN: 4 mg/dL — ABNORMAL LOW (ref 6–23)
Chloride: 105 mEq/L (ref 96–112)
Creatinine, Ser: 0.59 mg/dL (ref 0.50–1.10)
GFR calc Af Amer: >90 mL/min (ref >90)
Potassium: 3.6 mEq/L (ref 3.5–5.1)

## 2013-01-13 MED ORDER — OXYBUTYNIN CHLORIDE 5 MG PO TABS: 5.0000 mg | ORAL_TABLET | Freq: Three times a day (TID) | ORAL | Status: DC | PRN

## 2013-01-13 MED ORDER — LEVOFLOXACIN 500 MG PO TABS: 500.0000 mg | ORAL_TABLET | Freq: Every day | ORAL | Status: AC

## 2013-01-13 NOTE — Discharge Summary (Signed)
Physician Discharge Summary  Colleen Duncan XBJ:[id number] DOB: 08-Jan-1971 DOA: 01/11/2013  PCP: Cassell Smiles., MD  Admit date: 01/11/2013 Discharge date: 01/13/2013  Time spent: Greater than 30 minutes  Recommendations for Outpatient Follow-up:  1. Followup with urology at [facility] as previously scheduled in a couple of days. 2. Followup with primary care physician in approximately 4-6 weeks for repeat chest x-ray.  Discharge Diagnoses:  1. Right-sided community-acquired pneumonia, clinically improving. 2. Interstitial cystitis with no evidence of UTI on this admission. 3. Hypertension.   Discharge Condition: Stable and improving.  Diet recommendation: Regular.  Filed Weights   01/11/13 0729 01/11/13 1111  Weight: 63.05 kg (139 lb) 63.05 kg (139 lb)    History of present illness:  This very pleasant 42 year old lady presented to the hospital with symptoms of fever. Please see initial history as outlined below: HPI: Colleen Duncan is a 42 y.o. female who presents with a two-day history of fever associated with dry cough and increasing dyspnea. She had cystoscopy done at [facility] approximately a week ago for a second opinion for her bladder problems. The diagnoses of interstitial cystitis, which was made previously was confirmed by the physician at [facility]. Since this procedure, she has had lower abdominal discomfort with some nausea. However, the fever was a new symptom which started 2 days ago as mentioned above. When she was evaluated in the emergency room, chest x-ray showed impressive pneumonia on the right side. She is now being admitted for treatment of this. Urine also is suggestive of UTI.  Hospital Course:  Interestingly when the patient presented to the hospital, her chest x-ray was very impressive for a right-sided lower lobe and possibly middle lobe pneumonia. Urine was suggestive of a UTI. Therefore she was admitted and treated with intravenous antibiotics. She was  also dehydrated clinically and she was given IV fluids. She has improved significantly since hospitalization but she continues to have hematuria, frank associated with some abdominal/pelvic discomfort. She is due to have an appointment with [facility] urology in a couple of days after she had her cystoscopy last week. Her chest x-ray surprisingly has improved already and she feels better. She is now stable for discharge. She will need a further one week course of Levaquin for her pneumonia and she will need a repeat chest x-ray in 4-6 weeks' time with her primary care physician.  Procedures:  None.   Consultations:  None.  Discharge Exam: Filed Vitals:   01/13/13 0819  BP: 135/82  Pulse:   Temp:   Resp:     General: She looks systemically well. She is not toxic or septic. She is hydrated well. Cardiovascular: Heart sounds are present without murmurs or added sounds. Respiratory: Lung fields, surprisingly, her absolutely clear. This would probably indicate an atypical pneumonia. Her abdomen is soft and mildly tender in the lower/pelvic areas. She does not have an acute abdomen clinically, however. She is alert and orientated.  Discharge Instructions  Discharge Orders   Future Appointments Provider Department Dept Phone   01/30/2013 8:00 AM [facility] [phone]   Future Orders Complete By Expires   Diet - low sodium heart healthy  As directed    Increase activity slowly  As directed        Medication List    STOP taking these medications       sulfamethoxazole-trimethoprim 800-160 MG per tablet  Commonly known as:  BACTRIM DS      TAKE these medications  ALPRAZolam 0.5 MG tablet  Commonly known as:  XANAX  Take 0.5 mg by mouth 3 (three) times daily as needed for sleep.     amitriptyline 25 MG tablet  Commonly known as:  ELAVIL  Take 25 mg by mouth at bedtime.     esomeprazole 40 MG capsule  Commonly known as:   NEXIUM  Take 1 capsule (40 mg total) by mouth 2 (two) times daily before a meal.     gabapentin 300 MG capsule  Commonly known as:  NEURONTIN  Take 300 mg by mouth 3 (three) times daily.     hyoscyamine 0.125 MG SL tablet  Commonly known as:  LEVSIN SL  Place 0.125 mg under the tongue every 4 (four) hours as needed for cramping.     ibuprofen 200 MG tablet  Commonly known as:  ADVIL,MOTRIN  Take 400-600 mg by mouth every 8 (eight) hours as needed (She takes about 4-6 daily). For pain.     levofloxacin 500 MG tablet  Commonly known as:  LEVAQUIN  Take 1 tablet (500 mg total) by mouth daily.     MUCINEX FAST-MAX CONGEST COUGH 2.5-5-100 MG/5ML Liqd  Generic drug:  Phenylephrine-DM-GG  Take 30 mLs by mouth every 6 (six) hours as needed (congestion/cough).     oxybutynin 5 MG tablet  Commonly known as:  DITROPAN  Take 1 tablet (5 mg total) by mouth 3 (three) times daily as needed.     oxyCODONE-acetaminophen 5-325 MG per tablet  Commonly known as:  PERCOCET/ROXICET  Take 1 tablet by mouth every 4 (four) hours as needed. For pain.     pentosan polysulfate 100 MG capsule  Commonly known as:  ELMIRON  Take 100 mg by mouth 2 (two) times daily.     potassium chloride 10 MEQ tablet  Commonly known as:  K-DUR  Take 10 mEq by mouth 2 (two) times daily.     PRESCRIPTION MEDICATION  Irrigate with 15 mLs as directed daily as needed (bladder pain). Each 15ml includes: 50,000 units heparin/ 200mg  lidocaine.  PATIENT GETS PREMIXED FROM [facility] [place] (PH# [phone]).     promethazine 25 MG tablet  Commonly known as:  PHENERGAN  Take 25 mg by mouth every 6 (six) hours as needed for nausea.     propranolol 20 MG tablet  Commonly known as:  INDERAL  Take 20 mg by mouth 2 (two) times daily.     sennosides-docusate sodium 8.6-50 MG tablet  Commonly known as:  SENOKOT-S  Take 1 tablet by mouth daily. Take 1 tablet by mouth daily for 30 days.        Allergies  Allergen Reactions  . Morphine Rash       Follow-up Information   Follow up with Cassell Smiles., MD. Schedule an appointment as soon as possible for a visit in 4 weeks. (He will need followup chest x-ray in about 4-6 weeks.)    Specialty:  Internal Medicine   Contact information:   [address] [place] [address] [phone]        The results of significant diagnostics from this hospitalization (including imaging, microbiology, ancillary and laboratory) are listed below for reference.    Significant Diagnostic Studies: Dg Chest 2 View  01/12/2013   CLINICAL DATA:  Evaluate pneumonia  EXAM: CHEST  2 VIEW  COMPARISON:  Chest radiograph 01/11/2013  FINDINGS: Normal cardiac silhouette. There is improvement in the diffuse airspace disease seen in the right upper lobe  and right lower lobe. Fine airspace disease does remain. No pleural fluid. No consolidation. No pneumothorax. Anterior cervical fusion noted. Prior cholecystectomy.  IMPRESSION: Improvement and right upper lobe and right lower lobe airspace disease.   Electronically Signed   By: Genevive Bi M.D.   On: 01/12/2013 09:34   Dg Chest 2 View  01/11/2013   CLINICAL DATA:  Fever  EXAM: CHEST  2 VIEW  COMPARISON:  April 22, 2005  FINDINGS: There is extensive airspace consolidation involving portions of the posterior segment right upper lobe as well as much of the right lower lobe. Left lung is clear. Heart size and pulmonary vascularity are normal. No adenopathy. There is postoperative change in the lower cervical spine.  IMPRESSION: There is infiltrate throughout much of the right lower lobe as well as a portion of the posterior segment of the right upper lobe. Left lung clear.   Electronically Signed   By: Bretta Bang M.D.   On: 01/11/2013 08:25    Microbiology: Recent Results (from the past 240 hour(s))  URINE CULTURE     Status: None   Collection Time    01/11/13  7:39 AM       Result Value Range Status   Specimen Description URINE, CLEAN CATCH   Final   Special Requests NONE   Final   Culture  Setup Time     Final   Value: 01/11/2013 14:09     Performed at [facility] Count     Final   Value: 8,000 COLONIES/ML     Performed at [facility]   Culture     Final   Value: INSIGNIFICANT GROWTH     Performed at [facility]   Report Status 01/12/2013 FINAL   Final  CULTURE, BLOOD (ROUTINE X 2)     Status: None   Collection Time    01/11/13 12:07 PM      Result Value Range Status   Specimen Description BLOOD LEFT ARM   Final   Special Requests BOTTLES DRAWN AEROBIC AND ANAEROBIC 6CC   Final   Culture NO GROWTH 1 DAY   Final   Report Status PENDING   Incomplete  CULTURE, BLOOD (ROUTINE X 2)     Status: None   Collection Time    01/11/13  1:00 PM      Result Value Range Status   Specimen Description BLOOD LEFT ARM   Final   Special Requests BOTTLES DRAWN AEROBIC ONLY 4CC   Final   Culture NO GROWTH 1 DAY   Final   Report Status PENDING   Incomplete  CULTURE, EXPECTORATED SPUTUM-ASSESSMENT     Status: None   Collection Time    01/12/13  7:52 AM      Result Value Range Status   Specimen Description SPUTUM   Final   Special Requests Normal   Final   Sputum evaluation     Final   Value: THIS SPECIMEN IS ACCEPTABLE. RESPIRATORY CULTURE REPORT TO FOLLOW.   Report Status 01/12/2013 FINAL   Final     Labs: Basic Metabolic Panel:  Recent Labs Lab 01/11/13 0741 01/12/13 0628 01/13/13 0549  NA 133* 135 140  K 3.7 3.9 3.6  CL 97 104 105  CO2 23 23 26   GLUCOSE 130* 103* 109*  BUN 12 6 4*  CREATININE 0.72 0.60 0.59  CALCIUM 9.0 8.1* 9.0   Liver Function Tests:  Recent Labs Lab 01/12/13 0628  AST 12  ALT 7  ALKPHOS 56  BILITOT 0.2*  PROT 5.5*  ALBUMIN 2.6*     CBC:  Recent Labs Lab 01/11/13 0741 01/12/13 0628 01/13/13 0549  WBC 7.9 6.5 6.0  NEUTROABS 6.9  --   --   HGB 9.9* 7.9* 8.6*  HCT 30.4*  25.2* 27.6*  MCV 87.6 89.0 89.9  PLT 231 218 254    CBG:      Signed:  GOSRANI,NIMISH C  [facility] 01/13/2013, 8:31 AM

## 2013-01-13 NOTE — Discharge Summary (Signed)
Pt stated that she was ready to go home and pain was under control.  Pts IV removed and DC papers were explained including FU appointment.  Also educated on S/sx of continued pneumonia and reasons to call Dr.  All explained with family in room.  Pt walked to car by myself and family.

## 2013-01-13 NOTE — Plan of Care (Signed)
Educated pt on balancing act of controlling pain with med and causing slowing of bowels, which can cause infection to not fully be excreted.

## 2013-01-15 LAB — CULTURE, RESPIRATORY W GRAM STAIN

## 2013-01-16 LAB — CULTURE, BLOOD (ROUTINE X 2): Culture: NO GROWTH

## 2013-01-30 ENCOUNTER — Ambulatory Visit: Payer: BC Managed Care – PPO | Admitting: Gastroenterology

## 2013-01-31 ENCOUNTER — Telehealth: Payer: Self-pay | Admitting: *Deleted

## 2013-01-31 DIAGNOSIS — R197 Diarrhea, unspecified: Secondary | ICD-10-CM

## 2013-01-31 DIAGNOSIS — K625 Hemorrhage of anus and rectum: Secondary | ICD-10-CM

## 2013-01-31 MED ORDER — ONDANSETRON HCL 4 MG PO TABS: 4.0000 mg | ORAL_TABLET | Freq: Three times a day (TID) | ORAL | Status: DC | PRN

## 2013-01-31 NOTE — Telephone Encounter (Signed)
Pt called and made a appt. For 02/14/13 at 8:00 with AS pt states the medication for nausea is not helping, pt states yesterday evening she started having nausea diarrhea and some blood in stools. Please advise [phone]

## 2013-01-31 NOTE — Telephone Encounter (Signed)
Called pt- she had an appointment yesterday and cancelled it because she wasn't having any problems and she recently got out of the hospital with pneumonia and it trying to get better from that. (pt has had 9 cancelled appointments since July) last night she started having R side abd pain and mucous in her stool. Also having BRBPR when wiping. She feels like this is d/t her hemorrhoids. Also having some nausea. She said her symptoms are the same as they were when she saw LSL the last time.

## 2013-01-31 NOTE — Telephone Encounter (Signed)
RX for zofran sent in. Would recommend CBC (hgb 8.5 earlier this month). If diarrhea, let's get a C.Diff pcr. OV with AS as planned.

## 2013-02-01 LAB — CBC WITH DIFFERENTIAL/PLATELET
HCT: 32 % — ABNORMAL LOW (ref 36.0–46.0)
Hemoglobin: 10.4 g/dL — ABNORMAL LOW (ref 12.0–15.0)
Lymphocytes Relative: 16 % (ref 12–46)
Lymphs Abs: 0.8 10*3/uL (ref 0.7–4.0)
MCHC: 32.5 g/dL (ref 30.0–36.0)
Monocytes Absolute: 0.5 10*3/uL (ref 0.1–1.0)
Monocytes Relative: 10 % (ref 3–12)
Neutro Abs: 3.6 10*3/uL (ref 1.7–7.7)
Platelets: 432 10*3/uL — ABNORMAL HIGH (ref 150–400)
WBC: 5.1 10*3/uL (ref 4.0–10.5)

## 2013-02-01 NOTE — Telephone Encounter (Signed)
Pt is aware, lab order faxed to [facility] for cdiff. Pt stated she had cbc done at [place] last week. Chelsey please call Robbie Lis and request copy of blood work from last week.

## 2013-02-03 NOTE — Telephone Encounter (Signed)
Quick Note:  H/H better than during recent hospitalization but still low. EGD/TCS earlier this year. Await C.Diff pcr. Keep OV with Tobi Bastos. ______

## 2013-02-04 NOTE — Telephone Encounter (Signed)
Proceed copy of labs from PCP dated 01/23/2013. Iron 11 low, TIBC 362, iron saturations 3%, white blood cell count 5600, hemoglobin 9.4 low, hematocrit 28.70, MCV 84.4, platelet count 520,000.  EGD and colonoscopy earlier this year as outlined under past surgical history.  Await C. difficile PCR. Recommendations regarding iron deficiency anemia to be determined at time of office visit.

## 2013-02-04 NOTE — Telephone Encounter (Signed)
Requested labs

## 2013-02-06 LAB — CBC WITH DIFFERENTIAL/PLATELET
HCT: 29 %
HGB: 9.4 g/dL
Iron: 11
MCV: 84.4 fL
platelet count: 522

## 2013-02-14 ENCOUNTER — Ambulatory Visit (INDEPENDENT_AMBULATORY_CARE_PROVIDER_SITE_OTHER): Payer: BC Managed Care – PPO | Admitting: Gastroenterology

## 2013-02-14 ENCOUNTER — Encounter: Payer: Self-pay | Admitting: Gastroenterology

## 2013-02-14 VITALS — BP 122/77 | HR 87 | Temp 97.5°F | Wt 144.4 lb

## 2013-02-14 DIAGNOSIS — D649 Anemia, unspecified: Secondary | ICD-10-CM

## 2013-02-14 DIAGNOSIS — R110 Nausea: Secondary | ICD-10-CM

## 2013-02-14 DIAGNOSIS — R634 Abnormal weight loss: Secondary | ICD-10-CM

## 2013-02-14 DIAGNOSIS — K5900 Constipation, unspecified: Secondary | ICD-10-CM

## 2013-02-14 MED ORDER — LINACLOTIDE 145 MCG PO CAPS: 145.0000 ug | ORAL_CAPSULE | Freq: Every day | ORAL | Status: DC

## 2013-02-14 NOTE — Assessment & Plan Note (Signed)
Improved, weight gain noted. Continue to monitor. 6 week f/u.

## 2013-02-14 NOTE — Assessment & Plan Note (Signed)
Likely contributor to nausea, some component of abdominal pain. Start Linzess 145 mcg daily. Due to hx of anal canal hemorrhoids, may benefit from outpatient banding in future. Reassess in 6 weeks.

## 2013-02-14 NOTE — Progress Notes (Signed)
Referring Provider: Elfredia Nevins, MD Primary Care Physician:  Cassell Smiles., MD Primary GI: Dr. Jena Gauss   Chief Complaint  Patient presents with  . Follow-up  . Nausea    HPI:   Returns today in follow-up with history of IBS, chronic abdominal pain, interstitial cystitis, intermittent rectal bleeding. GES normal. Colonoscopy and EGD on file from earlier this year. Fell last night, bruised right shoulder, arm feels tight. Feels real sore.  Was having significant loose stools for about 3 days in October, and we ordered Cdiff. This was uncompleted due to resolution of diarrhea. States hemorrhoid prolapses, has to put back. No acute issues currently. Hemorrhoid flares when multiple loose stools. Avoiding aspirin powders. Currently dealing with constipation. Miralax BID. Stool softeners. Not helping. Zofran works well for nausea. No relief with Phenergan.  Weight improved since last visit.   Past Medical History  Diagnosis Date  . PONV (postoperative nausea and vomiting)   . Hypertension     on inderal-instructed to take dos  . Seasonal allergies   . GERD (gastroesophageal reflux disease)     nexium-instructed to take dos  . Depression   . Irritable bowel   . Interstitial cystitis   . Migraine     migraines-treated with inderal along with treatment for htn  . Chronic neck pain   . DDD (degenerative disc disease), cervical   . Liver nodule STABLE BENIGN FOCAL HYPERPLASIA NODULAR LEFT HEPATIC LOBE    PER MRI  01-17-2012, last MRI recommended in April 2015, is stable no further workup will be needed   . History of head injury 2012-- LOC BUT NO RESIDUALS (PT RACES GO-CARTS)  . Frequency of urination   . Urgency of urination   . Nocturia   . Right arm weakness SECONARDAY TO CERVICAL FUSION  . Anxiety   . Anxiety attack   . Hemorrhoids   . Collagen vascular disease   . Diabetes mellitus without complication     Past Surgical History  Procedure Laterality Date  . Knee  arthroscopy  1992  &  1988    bilateral  . Ectopic pregnancy surgery  1993  . Tonsillectomy    . Laparoscopy  01/04/2011    Procedure: LAPAROSCOPY DIAGNOSTIC;  Surgeon: Melony Overly;  Location: [facility];  Service: Gynecology;  Laterality: N/A;  Bilateral salpingoectomy/ LYSIS ADHESIONS  . Cysto/  hod  01-26-2010 ;  09-01-2003 ;   DR NESI    CHRONIC I.C.  . Anterior cervical decomp/discectomy fusion  04-25-2005    C6 - 7  . Right ureteroscopic stone extraction  05-31-2002  . Laparoscopic cholecystectomy  11-02-2001  . Vaginal hysterectomy  07-05-2001  . Cysto with hydrodistension  01/20/2012    Procedure: CYSTOSCOPY/HYDRODISTENSION;  Surgeon: Lindaann Slough, MD;  Location: [facility];  Service: Urology;  Laterality: N/A;  Bladder instillation of Marcaine and Pyridium   . Esophagogastroduodenoscopy  02/15/2007    Dr. Jena Gauss- noncritical subtle schatzki's ring, o/w normal. tiny hiatal hernia o/w normal stomach  . Esophagogastroduodenoscopy  06/18/2007    Dr. Elmer Ramp esophagus, stomach, D1 and D2  . Bravo ph study  06/18/2007    Dr. Jena Gauss- study indicates extremely good control of gastroesophageal reflux while on acid supppression therapy.  . Esophagogastroduodenoscopy (egd) with esophageal dilation  05/07/2012    Rourk-small HH, benign esophageal bx, Maloney dilation  . Colonoscopy N/A 06/18/2012    UJW:[id number] anal canal hemorrhoid/Tiny colonic ulcers (benign likely related to aspirin plus or minus Elmiron)    Current  Outpatient Prescriptions  Medication Sig Dispense Refill  . ALPRAZolam (XANAX) 0.5 MG tablet Take 0.5 mg by mouth 3 (three) times daily as needed for sleep.      Marland Kitchen amitriptyline (ELAVIL) 25 MG tablet Take 25 mg by mouth at bedtime.      Marland Kitchen esomeprazole (NEXIUM) 40 MG capsule Take 1 capsule (40 mg total) by mouth 2 (two) times daily before a meal.  60 capsule  11  . gabapentin (NEURONTIN) 300 MG capsule Take 300 mg by mouth 3 (three) times daily.      .  hyoscyamine (LEVSIN SL) 0.125 MG SL tablet Place 0.125 mg under the tongue every 4 (four) hours as needed for cramping.       Marland Kitchen ibuprofen (ADVIL,MOTRIN) 200 MG tablet Take 400-600 mg by mouth every 8 (eight) hours as needed (She takes about 4-6 daily). For pain.      Marland Kitchen ondansetron (ZOFRAN) 4 MG tablet Take 1 tablet (4 mg total) by mouth every 8 (eight) hours as needed for nausea.  30 tablet  1  . oxyCODONE-acetaminophen (PERCOCET) 5-325 MG per tablet Take 1 tablet by mouth every 4 (four) hours as needed. For pain.      . pentosan polysulfate (ELMIRON) 100 MG capsule Take 100 mg by mouth 2 (two) times daily.      Marland Kitchen Phenylephrine-DM-GG (MUCINEX FAST-MAX CONGEST COUGH) 2.5-5-100 MG/5ML LIQD Take 30 mLs by mouth every 6 (six) hours as needed (congestion/cough).      . potassium chloride (K-DUR) 10 MEQ tablet Take 10 mEq by mouth 2 (two) times daily.      Marland Kitchen PRESCRIPTION MEDICATION Irrigate with 15 mLs as directed daily as needed (bladder pain). Each 15ml includes: 50,000 units heparin/ 200mg  lidocaine.  PATIENT GETS PREMIXED FROM [facility] [place] (PH# [phone]).      . promethazine (PHENERGAN) 25 MG tablet Take 25 mg by mouth every 6 (six) hours as needed for nausea.      . propranolol (INDERAL) 20 MG tablet Take 20 mg by mouth 2 (two) times daily.       Marland Kitchen oxybutynin (DITROPAN) 5 MG tablet Take 1 tablet (5 mg total) by mouth 3 (three) times daily as needed.  90 tablet  0   No current facility-administered medications for this visit.    Allergies as of 02/14/2013 - Review Complete 02/14/2013  Allergen Reaction Noted  . Morphine Rash     Family History  Problem Relation Age of Onset  . Pancreatic cancer Maternal Grandmother   . Diabetes Mother   . Heart disease Father   . Diabetes Sister     History   Social History  . Marital Status: Married    Spouse Name: N/A    Number of Children: 2  . Years of Education: N/A   Occupational History  . GIS     Social  History Main Topics  . Smoking status: Never Smoker   . Smokeless tobacco: Never Used  . Alcohol Use: No  . Drug Use: No  . Sexual Activity: None   Other Topics Concern  . None   Social History Narrative  . None    Review of Systems: As mentioned in HPI.   Physical Exam: BP 122/77  Pulse 87  Temp(Src) 97.5 F (36.4 C) (Oral)  Wt 144 lb 6.4 oz (65.499 kg) General:   Alert and oriented. No distress noted. Pleasant and cooperative.  Head:  Normocephalic and atraumatic. Eyes:  Conjuctiva clear without scleral icterus. Mouth:  Oral mucosa pink and moist. Good dentition. No lesions. Abdomen:  +BS, soft, non-tender and non-distended. No rebound or guarding. No HSM or masses noted. Rectal: no external hemorrhoids appreciated, no obvious anal fissure, internal exam with some discomfort posterior rectum but no appreciable mass, thrombosis.  Msk:  Symmetrical without gross deformities. Normal posture. Extremities:  Without edema. Neurologic:  Alert and  oriented x4;  grossly normal neurologically. Skin:  Right posterior shoulder with superficial abrasions, full ROM noted.  Psych:  Alert and cooperative. Normal mood and affect.  Lab Results  Component Value Date   WBC 5.1 02/01/2013   HGB 10.4* 02/01/2013   HCT 32.0* 02/01/2013   MCV 83.3 02/01/2013   PLT 432* 02/01/2013   Lab Results  Component Value Date   IRON 11 01/23/2013   TIBC 362 01/23/2013

## 2013-02-14 NOTE — Assessment & Plan Note (Signed)
GES normal. EGD on file. Zofran as needed.

## 2013-02-14 NOTE — Patient Instructions (Signed)
Start taking Linzess each morning, 30 minutes before breakfast. This is for constipation. We have provided a month free voucher and refills to your pharmacy.  Please have blood work drawn. We may need to have you swallow a little capsule that takes pictures of your small intestine.   We will see you back in 6 weeks or sooner if needed.

## 2013-02-14 NOTE — Assessment & Plan Note (Signed)
With low iron, ferritin unknown. Hgb improved from past. EGD/TCS on file. Likely multifactorial. Continue to refrain from NSAIDs and aspirin powders, which she reports she is doing. Obtain ferritin level now. Capsule study likely to wrap up GI evaluation.

## 2013-02-18 NOTE — Progress Notes (Signed)
cc'd to pcp 

## 2013-02-22 ENCOUNTER — Other Ambulatory Visit: Payer: Self-pay | Admitting: Gastroenterology

## 2013-03-11 ENCOUNTER — Emergency Department (HOSPITAL_COMMUNITY): Payer: BC Managed Care – PPO

## 2013-03-11 ENCOUNTER — Emergency Department (HOSPITAL_COMMUNITY)
Admission: EM | Admit: 2013-03-11 | Discharge: 2013-03-12 | Disposition: A | Discharge status: Home / Self Care | Payer: BC Managed Care – PPO | Attending: Emergency Medicine | Admitting: Emergency Medicine

## 2013-03-11 ENCOUNTER — Encounter (HOSPITAL_COMMUNITY): Payer: Self-pay | Admitting: Emergency Medicine

## 2013-03-11 DIAGNOSIS — R29898 Other symptoms and signs involving the musculoskeletal system: Secondary | ICD-10-CM | POA: Insufficient documentation

## 2013-03-11 DIAGNOSIS — F411 Generalized anxiety disorder: Secondary | ICD-10-CM | POA: Insufficient documentation

## 2013-03-11 DIAGNOSIS — R112 Nausea with vomiting, unspecified: Secondary | ICD-10-CM | POA: Insufficient documentation

## 2013-03-11 DIAGNOSIS — E119 Type 2 diabetes mellitus without complications: Secondary | ICD-10-CM | POA: Insufficient documentation

## 2013-03-11 DIAGNOSIS — R5381 Other malaise: Secondary | ICD-10-CM | POA: Insufficient documentation

## 2013-03-11 DIAGNOSIS — K7689 Other specified diseases of liver: Secondary | ICD-10-CM | POA: Insufficient documentation

## 2013-03-11 DIAGNOSIS — R350 Frequency of micturition: Secondary | ICD-10-CM | POA: Insufficient documentation

## 2013-03-11 DIAGNOSIS — K649 Unspecified hemorrhoids: Secondary | ICD-10-CM | POA: Insufficient documentation

## 2013-03-11 DIAGNOSIS — M310 Hypersensitivity angiitis: Secondary | ICD-10-CM | POA: Insufficient documentation

## 2013-03-11 DIAGNOSIS — N3010 Interstitial cystitis (chronic) without hematuria: Secondary | ICD-10-CM | POA: Insufficient documentation

## 2013-03-11 DIAGNOSIS — M5412 Radiculopathy, cervical region: Secondary | ICD-10-CM

## 2013-03-11 DIAGNOSIS — J309 Allergic rhinitis, unspecified: Secondary | ICD-10-CM | POA: Insufficient documentation

## 2013-03-11 DIAGNOSIS — M5030 Other cervical disc degeneration, unspecified cervical region: Secondary | ICD-10-CM | POA: Insufficient documentation

## 2013-03-11 DIAGNOSIS — R351 Nocturia: Secondary | ICD-10-CM | POA: Insufficient documentation

## 2013-03-11 DIAGNOSIS — M4712 Other spondylosis with myelopathy, cervical region: Secondary | ICD-10-CM | POA: Insufficient documentation

## 2013-03-11 DIAGNOSIS — K589 Irritable bowel syndrome without diarrhea: Secondary | ICD-10-CM | POA: Insufficient documentation

## 2013-03-11 DIAGNOSIS — G8929 Other chronic pain: Secondary | ICD-10-CM | POA: Insufficient documentation

## 2013-03-11 DIAGNOSIS — M542 Cervicalgia: Secondary | ICD-10-CM | POA: Insufficient documentation

## 2013-03-11 DIAGNOSIS — K219 Gastro-esophageal reflux disease without esophagitis: Secondary | ICD-10-CM | POA: Insufficient documentation

## 2013-03-11 DIAGNOSIS — F329 Major depressive disorder, single episode, unspecified: Secondary | ICD-10-CM | POA: Insufficient documentation

## 2013-03-11 DIAGNOSIS — F3289 Other specified depressive episodes: Secondary | ICD-10-CM | POA: Insufficient documentation

## 2013-03-11 DIAGNOSIS — I10 Essential (primary) hypertension: Secondary | ICD-10-CM | POA: Insufficient documentation

## 2013-03-11 HISTORY — PX: NECK SURGERY: SHX720

## 2013-03-11 LAB — TROPONIN I: Troponin I: <0.3 ng/mL (ref <0.30)

## 2013-03-11 LAB — CBC WITH DIFFERENTIAL/PLATELET
Basophils Absolute: 0.1 10*3/uL (ref 0.0–0.1)
Basophils Relative: 1 % (ref 0–1)
Eosinophils Absolute: 0.1 10*3/uL (ref 0.0–0.7)
Eosinophils Relative: 2 % (ref 0–5)
HCT: 31.6 % — ABNORMAL LOW (ref 36.0–46.0)
MCH: 27 pg (ref 26.0–34.0)
MCHC: 31.3 g/dL (ref 30.0–36.0)
MCV: 86.3 fL (ref 78.0–100.0)
Monocytes Absolute: 0.5 10*3/uL (ref 0.1–1.0)
Platelets: 293 10*3/uL (ref 150–400)
RDW: 14.5 % (ref 11.5–15.5)

## 2013-03-11 LAB — COMPREHENSIVE METABOLIC PANEL
AST: 13 U/L (ref 0–37)
Albumin: 3.6 g/dL (ref 3.5–5.2)
BUN: 16 mg/dL (ref 6–23)
CO2: 26 mEq/L (ref 19–32)
Calcium: 9.3 mg/dL (ref 8.4–10.5)
Creatinine, Ser: 0.64 mg/dL (ref 0.50–1.10)
GFR calc non Af Amer: >90 mL/min (ref >90)
Sodium: 139 mEq/L (ref 135–145)
Total Bilirubin: 0.2 mg/dL — ABNORMAL LOW (ref 0.3–1.2)

## 2013-03-11 MED ORDER — DEXAMETHASONE SODIUM PHOSPHATE 4 MG/ML IJ SOLN
10.0000 mg | Freq: Once | INTRAMUSCULAR | Status: AC
  Administered 2013-03-11: 10 mg via INTRAVENOUS
  Filled 2013-03-11: qty 3

## 2013-03-11 MED ORDER — OXYCODONE-ACETAMINOPHEN 5-325 MG PO TABS
2.0000 | ORAL_TABLET | Freq: Once | ORAL | Status: AC
  Administered 2013-03-11: 2 via ORAL
  Filled 2013-03-11: qty 2

## 2013-03-11 NOTE — ED Notes (Signed)
Pt transferred to [facility] to have a MRI.

## 2013-03-11 NOTE — ED Notes (Signed)
Report given to [facility] and [facility] ED.

## 2013-03-11 NOTE — ED Notes (Signed)
Care link to department to transport to [facility] ED.

## 2013-03-11 NOTE — ED Notes (Signed)
Pt states she was taking a bath this am and her left arm went numb. Pt has very little use of that arm at this time.

## 2013-03-11 NOTE — ED Provider Notes (Signed)
CSN: [id number]     Arrival date & time 03/11/13  1859 History  This chart was scribed for Colleen Octave, MD by Bennett Scrape, ED Scribe. This patient was seen in room APA05/APA05 and the patient's care was started at 7:46 PM.    Chief Complaint  Patient presents with  . Numbness    The history is provided by the patient. No language interpreter was used.    HPI Comments: Colleen Duncan is a 42 y.o. female with a h/o chronic neck pain due to bulging disc who presents to the Emergency Department complaining of sudden onset left arm numbness with associated weakness and paresthesias in her 3rd and 4th fingers that stared this morning. She states that she felt like she had a neck cramp that radiates into the bilateral shoulders and left upper chest while taking a bath around 10 AM this morning. She reports that the pain then turned to numbness and weakness in the left arm with paresthesias in the 3rd and 4th fingers while washing herself. She denies any recent falls or trauma. She has a h/o an unsuccessful neck fusion and states that she is managed by a pain clinic for her neck pain with oxycodone and percocet. She reports prior episodes of paraesthesias with similar symptoms. She denies having trouble swallowing or SOB, numbness or weakness in her lower extremities. She has a h/o HTN but denies any h/o DM, HLD or cardiac diseases.   She is left-handed.  PCP is Dr. Sherwood Gambler.  Past Medical History  Diagnosis Date  . PONV (postoperative nausea and vomiting)   . Hypertension     on inderal-instructed to take dos  . Seasonal allergies   . GERD (gastroesophageal reflux disease)     nexium-instructed to take dos  . Depression   . Irritable bowel   . Interstitial cystitis   . Migraine     migraines-treated with inderal along with treatment for htn  . Chronic neck pain   . DDD (degenerative disc disease), cervical   . Liver nodule STABLE BENIGN FOCAL HYPERPLASIA NODULAR LEFT HEPATIC LOBE    PER  MRI  01-17-2012, last MRI recommended in April 2015, is stable no further workup will be needed   . History of head injury 2012-- LOC BUT NO RESIDUALS (PT RACES GO-CARTS)  . Frequency of urination   . Urgency of urination   . Nocturia   . Right arm weakness SECONARDAY TO CERVICAL FUSION  . Anxiety   . Anxiety attack   . Hemorrhoids   . Collagen vascular disease   . Diabetes mellitus without complication    Past Surgical History  Procedure Laterality Date  . Knee arthroscopy  1992  &  1988    bilateral  . Ectopic pregnancy surgery  1993  . Tonsillectomy    . Laparoscopy  01/04/2011    Procedure: LAPAROSCOPY DIAGNOSTIC;  Surgeon: Melony Overly;  Location: [facility];  Service: Gynecology;  Laterality: N/A;  Bilateral salpingoectomy/ LYSIS ADHESIONS  . Cysto/  hod  01-26-2010 ;  09-01-2003 ;   DR NESI    CHRONIC I.C.  . Anterior cervical decomp/discectomy fusion  04-25-2005    C6 - 7  . Right ureteroscopic stone extraction  05-31-2002  . Laparoscopic cholecystectomy  11-02-2001  . Vaginal hysterectomy  07-05-2001  . Cysto with hydrodistension  01/20/2012    Procedure: CYSTOSCOPY/HYDRODISTENSION;  Surgeon: Lindaann Slough, MD;  Location: [facility];  Service: Urology;  Laterality: N/A;  Bladder instillation of  Marcaine and Pyridium   . Esophagogastroduodenoscopy  02/15/2007    Dr. Jena Gauss- noncritical subtle schatzki's ring, o/w normal. tiny hiatal hernia o/w normal stomach  . Esophagogastroduodenoscopy  06/18/2007    Dr. Elmer Ramp esophagus, stomach, D1 and D2  . Bravo ph study  06/18/2007    Dr. Jena Gauss- study indicates extremely good control of gastroesophageal reflux while on acid supppression therapy.  . Esophagogastroduodenoscopy (egd) with esophageal dilation  05/07/2012    Rourk-small HH, benign esophageal bx, Maloney dilation  . Colonoscopy N/A 06/18/2012    [id number] anal canal hemorrhoid/Tiny colonic ulcers (benign likely related to aspirin plus or minus Elmiron)    Family History  Problem Relation Age of Onset  . Pancreatic cancer Maternal Grandmother   . Diabetes Mother   . Heart disease Father   . Diabetes Sister    History  Substance Use Topics  . Smoking status: Never Smoker   . Smokeless tobacco: Never Used  . Alcohol Use: No   No OB history provided.  Review of Systems  A complete 10 system review of systems was obtained and all systems are negative except as noted in the HPI and PMH.    Allergies  Morphine  Home Medications   Current Outpatient Rx  Name  Route  Sig  Dispense  Refill  . ALPRAZolam (XANAX) 0.5 MG tablet   Oral   Take 0.5 mg by mouth 3 (three) times daily as needed for sleep.         Marland Kitchen amitriptyline (ELAVIL) 25 MG tablet   Oral   Take 25 mg by mouth at bedtime.         Marland Kitchen esomeprazole (NEXIUM) 40 MG capsule   Oral   Take 1 capsule (40 mg total) by mouth 2 (two) times daily before a meal.   60 capsule   11   . gabapentin (NEURONTIN) 300 MG capsule   Oral   Take 300 mg by mouth 3 (three) times daily.         . hyoscyamine (LEVSIN SL) 0.125 MG SL tablet   Sublingual   Place 0.125 mg under the tongue every 4 (four) hours as needed for cramping.          Marland Kitchen ibuprofen (ADVIL,MOTRIN) 200 MG tablet   Oral   Take 400-600 mg by mouth every 8 (eight) hours as needed (She takes about 4-6 daily). For pain.         Marland Kitchen Linaclotide (LINZESS) 145 MCG CAPS capsule   Oral   Take 1 capsule (145 mcg total) by mouth daily. 30 minutes before breakfast   30 capsule   5   . ondansetron (ZOFRAN) 4 MG tablet   Oral   Take 1 tablet (4 mg total) by mouth every 8 (eight) hours as needed for nausea.   30 tablet   1   . oxybutynin (DITROPAN) 5 MG tablet   Oral   Take 1 tablet (5 mg total) by mouth 3 (three) times daily as needed.   90 tablet   0   . oxyCODONE-acetaminophen (PERCOCET) 5-325 MG per tablet   Oral   Take 1 tablet by mouth every 4 (four) hours as needed. For pain.         . pentosan  polysulfate (ELMIRON) 100 MG capsule   Oral   Take 100 mg by mouth 2 (two) times daily.         Marland Kitchen Phenylephrine-DM-GG (MUCINEX FAST-MAX CONGEST COUGH) 2.5-5-100 MG/5ML LIQD   Oral  Take 30 mLs by mouth every 6 (six) hours as needed (congestion/cough).         . potassium chloride (K-DUR) 10 MEQ tablet   Oral   Take 10 mEq by mouth 2 (two) times daily.         Marland Kitchen PRESCRIPTION MEDICATION   Irrigation   Irrigate with 15 mLs as directed daily as needed (bladder pain). Each 15ml includes: 50,000 units heparin/ 200mg  lidocaine.  PATIENT GETS PREMIXED FROM [facility] [place] (PH# [phone]).         . promethazine (PHENERGAN) 25 MG tablet   Oral   Take 25 mg by mouth every 6 (six) hours as needed for nausea.         . propranolol (INDERAL) 20 MG tablet   Oral   Take 20 mg by mouth 2 (two) times daily.           Triage Vitals: BP 151/79  Pulse 84  Temp(Src) 98.5 F (36.9 C) (Oral)  Resp 20  Ht 5\' 4"  (1.626 m)  Wt 142 lb (64.411 kg)  BMI 24.36 kg/m2  SpO2 100%  Physical Exam  Nursing note and vitals reviewed. Constitutional: She is oriented to person, place, and time. She appears well-developed and well-nourished. No distress.  HENT:  Head: Normocephalic and atraumatic.  Eyes: Conjunctivae and EOM are normal.  Neck: Normal range of motion. Neck supple. No tracheal deviation present.  Cardiovascular: Normal rate, regular rhythm and normal heart sounds.   No murmur heard. Pulmonary/Chest: Effort normal and breath sounds normal. No respiratory distress. She has no wheezes. She has no rales.  Abdominal: Soft. Bowel sounds are normal. There is no tenderness.  Musculoskeletal: Normal range of motion. She exhibits no edema.  Neurological: She is alert and oriented to person, place, and time. No cranial nerve deficit.  3/5 grip strength on the left, 4/5 push/pull on the left, 5/5 on the right, cranial nerves 2-12 intact. 5/5 strength in bilateral  lower extremities. Ankle plantar and dorsiflexion intact. Great toe extension intact bilaterally.   Skin: Skin is warm and dry.  Psychiatric: She has a normal mood and affect. Her behavior is normal.    ED Course  Procedures (including critical care time)  Medications  oxyCODONE-acetaminophen (PERCOCET/ROXICET) 5-325 MG per tablet 2 tablet (2 tablets Oral Given 03/11/13 2023)  dexamethasone (DECADRON) injection 10 mg (10 mg Intravenous Given 03/11/13 2022)    DIAGNOSTIC STUDIES: Oxygen Saturation is 100% on room air, normal by my interpretation.    COORDINATION OF CARE: 7:56 PM-Discussed treatment plan which includes medications, CT of head and c-spine, CBC panel, CMP and troponin with pt at bedside and pt agreed to plan.   Labs Review Labs Reviewed  CBC WITH DIFFERENTIAL - Abnormal; Notable for the following:    RBC 3.66 (*)    Hemoglobin 9.9 (*)    HCT 31.6 (*)    All other components within normal limits  COMPREHENSIVE METABOLIC PANEL - Abnormal; Notable for the following:    Total Bilirubin 0.2 (*)    All other components within normal limits  TROPONIN I   Imaging Review Ct Head Wo Contrast  03/11/2013   CLINICAL DATA:  Left arm weakness and numbness. Hypertension migraine.  EXAM: CT HEAD WITHOUT CONTRAST  CT CERVICAL SPINE WITHOUT CONTRAST  TECHNIQUE: Multidetector CT imaging of the head and cervical spine was performed following the standard protocol without intravenous contrast. Multiplanar CT image reconstructions of the cervical spine were also generated.  COMPARISON:  MR head 12/2011.  CT cervical spine 09/19/2010.  FINDINGS: CT HEAD FINDINGS  No evidence for acute infarction, hemorrhage, mass lesion, hydrocephalus, or extra-axial fluid. There is no atrophy or white matter disease. Calvarium is intact. No acute sinus disease is evident. Negative mastoids.  CT CERVICAL SPINE FINDINGS  Status post C6-7 anterior cervical discectomy and interbody fusion with anterior plating.  Fusion appears solid with hardware intact.  Progressive disc space narrowing with 2 mm retrolisthesis at C5-C6 compared with the previous study. There is a calcified central protrusion at this level which appears to result in mild canal stenosis. Right-sided uncinate spurring narrows the foramen, potentially affecting the C6 nerve root.  Shallow central and leftward protrusion is suspected at C4-C5 with left-sided uncinate spurring. The left C5 nerve root could be affected.  No residual stenosis or neural impingement at C6-7. No adjacent segment disease at C7-T1. Unremarkable appearing soft tissues of the neck. Negative lung apices.  IMPRESSION: No acute intracranial abnormality.  Solid C6-7 fusion.  Central and leftward protrusion C4-C5 with uncinate spurring. Advanced disc space narrowing at C5-6 with right greater than left uncinate spurring and a calcified central protrusion.  Progressive degenerative change particularly at C5-C6 compared with 2012.   Electronically Signed   By: Davonna Belling M.D.   On: 03/11/2013 20:53   Ct Cervical Spine Wo Contrast  03/11/2013   CLINICAL DATA:  Left arm weakness and numbness. Hypertension migraine.  EXAM: CT HEAD WITHOUT CONTRAST  CT CERVICAL SPINE WITHOUT CONTRAST  TECHNIQUE: Multidetector CT imaging of the head and cervical spine was performed following the standard protocol without intravenous contrast. Multiplanar CT image reconstructions of the cervical spine were also generated.  COMPARISON:  MR head 12/2011.  CT cervical spine 09/19/2010.  FINDINGS: CT HEAD FINDINGS  No evidence for acute infarction, hemorrhage, mass lesion, hydrocephalus, or extra-axial fluid. There is no atrophy or white matter disease. Calvarium is intact. No acute sinus disease is evident. Negative mastoids.  CT CERVICAL SPINE FINDINGS  Status post C6-7 anterior cervical discectomy and interbody fusion with anterior plating. Fusion appears solid with hardware intact.  Progressive disc space  narrowing with 2 mm retrolisthesis at C5-C6 compared with the previous study. There is a calcified central protrusion at this level which appears to result in mild canal stenosis. Right-sided uncinate spurring narrows the foramen, potentially affecting the C6 nerve root.  Shallow central and leftward protrusion is suspected at C4-C5 with left-sided uncinate spurring. The left C5 nerve root could be affected.  No residual stenosis or neural impingement at C6-7. No adjacent segment disease at C7-T1. Unremarkable appearing soft tissues of the neck. Negative lung apices.  IMPRESSION: No acute intracranial abnormality.  Solid C6-7 fusion.  Central and leftward protrusion C4-C5 with uncinate spurring. Advanced disc space narrowing at C5-6 with right greater than left uncinate spurring and a calcified central protrusion.  Progressive degenerative change particularly at C5-C6 compared with 2012.   Electronically Signed   By: Davonna Belling M.D.   On: 03/11/2013 20:53    EKG Interpretation    Date/Time:  Monday March 11 2013 20:12:43 EST Ventricular Rate:  73 PR Interval:  142 QRS Duration: 76 QT Interval:  374 QTC Calculation: 412 R Axis:   70 Text Interpretation:  Normal sinus rhythm Normal ECG When compared with ECG of 30-Dec-2011 13:53, No significant change was found No significant change was found Confirmed by Manus Gunning  MD, Tishina Lown ([id number]) on 03/11/2013 8:34:29 PM  MDM   1. Cervical radiculopathy   2. Left arm weakness    Left arm numbness and weakness since 10 AM. No facial involvement no lower extremity involvement. No difficulty walking or talking. Known history of cervical disc disease.  Suspect cervical radiculopathy. Patient with significant weakness in her left arm. No involvement of head or face to suggest CVA. She is given IV steroids. CT scan shows disc protrusion on left which likely spinous or symptoms. Given arm weakness, will need MRI tonight. Discussed with Dr. Fredderick Phenix  at [facility].   I personally performed the services described in this documentation, which was scribed in my presence. The recorded information has been reviewed and is accurate.    Colleen Octave, MD 03/11/13 2106

## 2013-03-12 ENCOUNTER — Telehealth: Payer: Self-pay | Admitting: Gastroenterology

## 2013-03-12 MED ORDER — METHYLPREDNISOLONE 4 MG PO KIT: PACK | ORAL | Status: DC

## 2013-03-12 MED ORDER — HYDROMORPHONE HCL PF 1 MG/ML IJ SOLN
1.0000 mg | Freq: Once | INTRAMUSCULAR | Status: AC
  Administered 2013-03-12: 1 mg via INTRAVENOUS
  Filled 2013-03-12: qty 1

## 2013-03-12 NOTE — ED Provider Notes (Signed)
Assumed after patient transferred from [facility].  Patient with left arm pain, weakness, and numbness starting today upon waking.  No recent trauma.  Patient has history of chronic neck and right arm pain./Weakness for some time.  Has history of prior neck surgery done by Dr. Lovell Sheehan, C6/7 discectomy with anterior fusion in 2008.  She reports left arm numbness and pain is a new symptom for her.   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  Patient was transferred from [facility] for MRI.  Given concerns seen on CT scan.  MRI results show possible cord contusion along with multilevel stenosis and foraminal narrowing.  Discussed case with Dr. pool, on-call for neurosurgery.  He agrees with steroids, and followup with neurosurgeon of her choice.  Results for orders placed during the hospital encounter of 03/11/13  CBC WITH DIFFERENTIAL      Result Value Range   WBC 5.3  4.0 - 10.5 K/uL   RBC 3.66 (*) 3.87 - 5.11 MIL/uL   Hemoglobin 9.9 (*) 12.0 - 15.0 g/dL   HCT 16.1 (*) 09.6 - 04.5 %   MCV 86.3  78.0 - 100.0 fL   MCH 27.0  26.0 - 34.0 pg   MCHC 31.3  30.0 - 36.0 g/dL   RDW 40.9  81.1 - 91.4 %   Platelets 293  150 - 400 K/uL   Neutrophils Relative % 56  43 - 77 %   Neutro Abs 2.9  1.7 - 7.7 K/uL   Lymphocytes Relative 31  12 - 46 %   Lymphs Abs 1.7  0.7 - 4.0 K/uL   Monocytes Relative 10  3 - 12 %   Monocytes Absolute 0.5  0.1 - 1.0 K/uL   Eosinophils Relative 2  0 - 5 %   Eosinophils Absolute 0.1  0.0 - 0.7 K/uL   Basophils Relative 1  0 - 1 %   Basophils Absolute 0.1  0.0 - 0.1 K/uL  COMPREHENSIVE METABOLIC PANEL      Result Value Range   Sodium 139  135 - 145 mEq/L   Potassium 3.7  3.5 - 5.1 mEq/L   Chloride 105  96 - 112 mEq/L   CO2 26  19 - 32 mEq/L   Glucose, Bld 97  70 - 99 mg/dL   BUN 16  6 - 23 mg/dL   Creatinine, Ser 7.82  0.50 - 1.10 mg/dL   Calcium 9.3  8.4 - 95.6 mg/dL   Total Protein 7.4  6.0 - 8.3 g/dL   Albumin 3.6  3.5 - 5.2 g/dL   AST 13  0 - 37 U/L    ALT 12  0 - 35 U/L   Alkaline Phosphatase 39  39 - 117 U/L   Total Bilirubin 0.2 (*) 0.3 - 1.2 mg/dL   GFR calc non Af Amer >90  >90 mL/min   GFR calc Af Amer >90  >90 mL/min  TROPONIN I      Result Value Range   Troponin I <0.30  <0.30 ng/mL   Ct Head Wo Contrast  03/11/2013   CLINICAL DATA:  Left arm weakness and numbness. Hypertension migraine.  EXAM: CT HEAD WITHOUT CONTRAST  CT CERVICAL SPINE WITHOUT CONTRAST  TECHNIQUE: Multidetector CT imaging of the head and cervical spine was performed following the standard protocol without intravenous contrast. Multiplanar CT image reconstructions of the cervical spine were also generated.  COMPARISON:  MR head 12/2011.  CT cervical spine 09/19/2010.  FINDINGS: CT  HEAD FINDINGS  No evidence for acute infarction, hemorrhage, mass lesion, hydrocephalus, or extra-axial fluid. There is no atrophy or white matter disease. Calvarium is intact. No acute sinus disease is evident. Negative mastoids.  CT CERVICAL SPINE FINDINGS  Status post C6-7 anterior cervical discectomy and interbody fusion with anterior plating. Fusion appears solid with hardware intact.  Progressive disc space narrowing with 2 mm retrolisthesis at C5-C6 compared with the previous study. There is a calcified central protrusion at this level which appears to result in mild canal stenosis. Right-sided uncinate spurring narrows the foramen, potentially affecting the C6 nerve root.  Shallow central and leftward protrusion is suspected at C4-C5 with left-sided uncinate spurring. The left C5 nerve root could be affected.  No residual stenosis or neural impingement at C6-7. No adjacent segment disease at C7-T1. Unremarkable appearing soft tissues of the neck. Negative lung apices.  IMPRESSION: No acute intracranial abnormality.  Solid C6-7 fusion.  Central and leftward protrusion C4-C5 with uncinate spurring. Advanced disc space narrowing at C5-6 with right greater than left uncinate spurring and a  calcified central protrusion.  Progressive degenerative change particularly at C5-C6 compared with 2012.   Electronically Signed   By: Davonna Belling M.D.   On: 03/11/2013 20:53   Ct Cervical Spine Wo Contrast  03/11/2013   CLINICAL DATA:  Left arm weakness and numbness. Hypertension migraine.  EXAM: CT HEAD WITHOUT CONTRAST  CT CERVICAL SPINE WITHOUT CONTRAST  TECHNIQUE: Multidetector CT imaging of the head and cervical spine was performed following the standard protocol without intravenous contrast. Multiplanar CT image reconstructions of the cervical spine were also generated.  COMPARISON:  MR head 12/2011.  CT cervical spine 09/19/2010.  FINDINGS: CT HEAD FINDINGS  No evidence for acute infarction, hemorrhage, mass lesion, hydrocephalus, or extra-axial fluid. There is no atrophy or white matter disease. Calvarium is intact. No acute sinus disease is evident. Negative mastoids.  CT CERVICAL SPINE FINDINGS  Status post C6-7 anterior cervical discectomy and interbody fusion with anterior plating. Fusion appears solid with hardware intact.  Progressive disc space narrowing with 2 mm retrolisthesis at C5-C6 compared with the previous study. There is a calcified central protrusion at this level which appears to result in mild canal stenosis. Right-sided uncinate spurring narrows the foramen, potentially affecting the C6 nerve root.  Shallow central and leftward protrusion is suspected at C4-C5 with left-sided uncinate spurring. The left C5 nerve root could be affected.  No residual stenosis or neural impingement at C6-7. No adjacent segment disease at C7-T1. Unremarkable appearing soft tissues of the neck. Negative lung apices.  IMPRESSION: No acute intracranial abnormality.  Solid C6-7 fusion.  Central and leftward protrusion C4-C5 with uncinate spurring. Advanced disc space narrowing at C5-6 with right greater than left uncinate spurring and a calcified central protrusion.  Progressive degenerative change  particularly at C5-C6 compared with 2012.   Electronically Signed   By: Davonna Belling M.D.   On: 03/11/2013 20:53   Mr Cervical Spine Wo Contrast  03/12/2013   CLINICAL DATA:  Numbness in left upper extremity after trauma.  EXAM: MRI CERVICAL SPINE WITHOUT CONTRAST  TECHNIQUE: Multiplanar, multisequence MR imaging was performed. No intravenous contrast was administered.  COMPARISON:  CT of cervical spine March 11, 2013 at 2032 hr. MRI of the cervical spine July 29, 2010.  FINDINGS: The patient is status post C6-7 ACDF, with bridging bone marrow signal consistent with arthrodesis and solid fusion. Severe C5-6 disc height loss, moderate C4-5 with decreased T2 signal within  the cervical disc most consistent with mild desiccation. Moderate chronic discogenic endplate changes at C5-6, suspected superimposed acute component though, hardware artifact may accentuate this finding. Mild chronic discogenic endplate change at C4-5.  Subtle patchy T2 bright signal within the cervical spinal cord predominately towards the left from C3-4 -C5-6, no syrinx. No myelomalacia. Craniocervical junction maintained. Included prevertebral and paraspinal soft tissues are nonsuspicious.  Level by level evaluation:  C2-C3: No disk bulge. Mild facet arthropathy without canal stenosis or neural foraminal narrowing.  C3-C4: 1-2 mm annular bulging asymmetric to the left, mild facet arthropathy. No canal stenosis. Mild left neural foraminal narrowing.  C4-C5: 3 mm broad-based disk bulge, similar with uncovertebral hypertrophy and mild facet arthropathy. Mild to moderate canal stenosis. Moderate right and moderate to severe left neural foraminal narrowing, slightly worse from prior MRI.  C5-C6: 3 mm broad-based disk bulge, uncovertebral hypertrophy and moderate facet arthropathy. Moderate canal stenosis. Severe right, mild to moderate left neural foraminal narrowing. Findings progressed from prior MRI.  C6-C7: ACDF.  C7-T1: No disc bulge, canal  stenosis nor neural foraminal narrowing.  IMPRESSION: No acute fracture or nor malalignment.  Mild expansile bright signal within the cervical spinal cord, predominantly on the left from C3-4 through C5-6 predominate may reflect cord contusion, possibly pre syrinx due to adjacent segment disease at C5-6, with resultant moderate canal stenosis at C5-6, mild to moderate at C4-5.  Neural foraminal narrowing C3-4 through C5-6: Severe on the right at C5-6, moderate to severe on the left at C4-5.   Electronically Signed   By: Awilda Metro   On: 03/12/2013 00:16      Olivia Mackie, MD 03/12/13 [phone]

## 2013-03-12 NOTE — Telephone Encounter (Signed)
Noted  

## 2013-03-12 NOTE — Telephone Encounter (Signed)
Patient called an cancelled her GIVENS Study due to being in the ER with multiple pinched nerves in her neck and she has lost use of her left arm and she wants to get this under control before going through with GIVENS

## 2013-03-18 ENCOUNTER — Ambulatory Visit: Admit: 2013-03-18 | Payer: BC Managed Care – PPO | Admitting: Internal Medicine

## 2013-03-18 SURGERY — IMAGING PROCEDURE, GI TRACT, INTRALUMINAL, VIA CAPSULE

## 2013-03-27 ENCOUNTER — Ambulatory Visit: Payer: BC Managed Care – PPO | Admitting: Gastroenterology

## 2013-06-17 ENCOUNTER — Other Ambulatory Visit (HOSPITAL_COMMUNITY): Payer: Self-pay | Admitting: Orthopedic Surgery

## 2013-06-17 DIAGNOSIS — M545 Low back pain, unspecified: Secondary | ICD-10-CM

## 2013-06-17 DIAGNOSIS — M549 Dorsalgia, unspecified: Secondary | ICD-10-CM

## 2013-06-24 ENCOUNTER — Ambulatory Visit (HOSPITAL_COMMUNITY)
Admission: RE | Admit: 2013-06-24 | Discharge: 2013-06-24 | Disposition: A | Discharge status: Home / Self Care | Payer: BC Managed Care – PPO | Source: Ambulatory Visit | Attending: Orthopedic Surgery | Admitting: Orthopedic Surgery

## 2013-06-24 DIAGNOSIS — M5126 Other intervertebral disc displacement, lumbar region: Secondary | ICD-10-CM | POA: Insufficient documentation

## 2013-06-24 DIAGNOSIS — M549 Dorsalgia, unspecified: Secondary | ICD-10-CM

## 2013-06-24 DIAGNOSIS — M545 Low back pain, unspecified: Secondary | ICD-10-CM | POA: Insufficient documentation

## 2013-08-01 ENCOUNTER — Other Ambulatory Visit (HOSPITAL_COMMUNITY): Payer: Self-pay | Admitting: Rehabilitation

## 2013-08-01 DIAGNOSIS — M542 Cervicalgia: Secondary | ICD-10-CM

## 2013-08-05 ENCOUNTER — Ambulatory Visit (HOSPITAL_COMMUNITY)
Admission: RE | Admit: 2013-08-05 | Discharge: 2013-08-05 | Disposition: A | Discharge status: Home / Self Care | Payer: BC Managed Care – PPO | Source: Ambulatory Visit | Attending: Rehabilitation | Admitting: Rehabilitation

## 2013-08-05 DIAGNOSIS — G9589 Other specified diseases of spinal cord: Secondary | ICD-10-CM | POA: Insufficient documentation

## 2013-08-05 DIAGNOSIS — M542 Cervicalgia: Secondary | ICD-10-CM | POA: Insufficient documentation

## 2013-08-05 DIAGNOSIS — R209 Unspecified disturbances of skin sensation: Secondary | ICD-10-CM | POA: Insufficient documentation

## 2013-08-05 DIAGNOSIS — M79609 Pain in unspecified limb: Secondary | ICD-10-CM | POA: Insufficient documentation

## 2013-08-05 DIAGNOSIS — Z981 Arthrodesis status: Secondary | ICD-10-CM | POA: Insufficient documentation

## 2013-08-27 ENCOUNTER — Other Ambulatory Visit: Payer: Self-pay | Admitting: Gastroenterology

## 2013-09-11 ENCOUNTER — Emergency Department (HOSPITAL_COMMUNITY): Payer: BC Managed Care – PPO

## 2013-09-11 ENCOUNTER — Emergency Department (HOSPITAL_COMMUNITY)
Admission: EM | Admit: 2013-09-11 | Discharge: 2013-09-11 | Disposition: A | Discharge status: Home / Self Care | Payer: BC Managed Care – PPO | Attending: Emergency Medicine | Admitting: Emergency Medicine

## 2013-09-11 ENCOUNTER — Encounter (HOSPITAL_COMMUNITY): Payer: Self-pay | Admitting: Emergency Medicine

## 2013-09-11 DIAGNOSIS — Z87448 Personal history of other diseases of urinary system: Secondary | ICD-10-CM | POA: Insufficient documentation

## 2013-09-11 DIAGNOSIS — Z79899 Other long term (current) drug therapy: Secondary | ICD-10-CM | POA: Insufficient documentation

## 2013-09-11 DIAGNOSIS — R109 Unspecified abdominal pain: Secondary | ICD-10-CM

## 2013-09-11 DIAGNOSIS — Z8739 Personal history of other diseases of the musculoskeletal system and connective tissue: Secondary | ICD-10-CM | POA: Insufficient documentation

## 2013-09-11 DIAGNOSIS — Z9089 Acquired absence of other organs: Secondary | ICD-10-CM | POA: Insufficient documentation

## 2013-09-11 DIAGNOSIS — G43909 Migraine, unspecified, not intractable, without status migrainosus: Secondary | ICD-10-CM | POA: Insufficient documentation

## 2013-09-11 DIAGNOSIS — Z8709 Personal history of other diseases of the respiratory system: Secondary | ICD-10-CM | POA: Insufficient documentation

## 2013-09-11 DIAGNOSIS — Z87828 Personal history of other (healed) physical injury and trauma: Secondary | ICD-10-CM | POA: Insufficient documentation

## 2013-09-11 DIAGNOSIS — K219 Gastro-esophageal reflux disease without esophagitis: Secondary | ICD-10-CM | POA: Insufficient documentation

## 2013-09-11 DIAGNOSIS — Z9889 Other specified postprocedural states: Secondary | ICD-10-CM | POA: Insufficient documentation

## 2013-09-11 DIAGNOSIS — N3010 Interstitial cystitis (chronic) without hematuria: Secondary | ICD-10-CM | POA: Insufficient documentation

## 2013-09-11 DIAGNOSIS — F411 Generalized anxiety disorder: Secondary | ICD-10-CM | POA: Insufficient documentation

## 2013-09-11 DIAGNOSIS — F3289 Other specified depressive episodes: Secondary | ICD-10-CM | POA: Insufficient documentation

## 2013-09-11 DIAGNOSIS — F329 Major depressive disorder, single episode, unspecified: Secondary | ICD-10-CM | POA: Insufficient documentation

## 2013-09-11 DIAGNOSIS — E119 Type 2 diabetes mellitus without complications: Secondary | ICD-10-CM | POA: Insufficient documentation

## 2013-09-11 DIAGNOSIS — I10 Essential (primary) hypertension: Secondary | ICD-10-CM | POA: Insufficient documentation

## 2013-09-11 LAB — URINE MICROSCOPIC-ADD ON

## 2013-09-11 LAB — URINALYSIS, ROUTINE W REFLEX MICROSCOPIC
BILIRUBIN URINE: NEGATIVE
GLUCOSE, UA: NEGATIVE mg/dL
KETONES UR: NEGATIVE mg/dL
Nitrite: NEGATIVE
PH: 6.5 (ref 5.0–8.0)
Protein, ur: NEGATIVE mg/dL
Specific Gravity, Urine: 1.02 (ref 1.005–1.030)
Urobilinogen, UA: 1 mg/dL (ref 0.0–1.0)

## 2013-09-11 MED ORDER — HYDROCODONE-ACETAMINOPHEN 5-325 MG PO TABS: 2.0000 | ORAL_TABLET | ORAL | Status: DC | PRN

## 2013-09-11 MED ORDER — KETOROLAC TROMETHAMINE 30 MG/ML IJ SOLN
30.0000 mg | Freq: Once | INTRAMUSCULAR | Status: AC
  Administered 2013-09-11: 30 mg via INTRAVENOUS
  Filled 2013-09-11: qty 1

## 2013-09-11 MED ORDER — SODIUM CHLORIDE 0.9 % IV BOLUS (SEPSIS)
1000.0000 mL | Freq: Once | INTRAVENOUS | Status: AC
  Administered 2013-09-11: 1000 mL via INTRAVENOUS

## 2013-09-11 MED ORDER — CEPHALEXIN 500 MG PO CAPS: 500.0000 mg | ORAL_CAPSULE | Freq: Four times a day (QID) | ORAL | Status: DC

## 2013-09-11 MED ORDER — PROMETHAZINE HCL 25 MG PO TABS: 25.0000 mg | ORAL_TABLET | Freq: Four times a day (QID) | ORAL | Status: DC | PRN

## 2013-09-11 MED ORDER — ONDANSETRON HCL 4 MG/2ML IJ SOLN
4.0000 mg | Freq: Once | INTRAMUSCULAR | Status: AC
  Administered 2013-09-11: 4 mg via INTRAVENOUS
  Filled 2013-09-11: qty 2

## 2013-09-11 MED ORDER — NAPROXEN 500 MG PO TABS: 500.0000 mg | ORAL_TABLET | Freq: Two times a day (BID) | ORAL | Status: DC

## 2013-09-11 MED ORDER — DEXTROSE 5 % IV SOLN
1.0000 g | Freq: Once | INTRAVENOUS | Status: AC
  Administered 2013-09-11: 1 g via INTRAVENOUS
  Filled 2013-09-11: qty 10

## 2013-09-11 NOTE — ED Notes (Signed)
Pt reporting pain in right flank.  Reports pain began yesterday.  Pt also reporting urine is cloudy and has foul odor.  Reports history of interstitial cyctitis that requires self-cathing.  States that she has also had a fever that she has been treating with Ibuprofen.

## 2013-09-11 NOTE — Discharge Instructions (Signed)
Please call your doctor for a followup appointment within 24-48 hours. When you talk to your doctor please let them know that you were seen in the emergency department and have them acquire all of your records so that they can discuss the findings with you and formulate a treatment plan to fully care for your new and ongoing problems. ° °

## 2013-09-11 NOTE — ED Provider Notes (Signed)
CSN: [id number]     Arrival date & time 09/11/13  0015 History  This chart was scribed for [name], MD by Martinique Peace, ED Scribe. The patient was seen in [place]. The patient's care was started at 12:42 AM.     Chief Complaint  Patient presents with  . Flank Pain    The history is provided by the patient. No language interpreter was used.  HPI Comments: Colleen Duncan is a 43 y.o. female with history interstitial cystitis who presents to the Emergency Department complaining of right side pain onset yesterday with associated dysuria, urinary frequency, foul urine odor, vomiting, nausea and diarrhea. She has a max temperature of 102. Pt reports that deep breathing exacerbates her side pain. Regarding her history or interstitial cystitis, she sometimes has to self catheter and does insulation of Lidocaine, Sodium and Heparin.  Pt sates to have history of neck surgery, cholecystectomy, and hysterectomy. She also states that her neck has improved after the surgery. She denies history of appendectomy. Pt further states she saw a nurse a week ago. She states to have no known allergies.    Past Medical History  Diagnosis Date  . PONV (postoperative nausea and vomiting)   . Hypertension     on inderal-instructed to take dos  . Seasonal allergies   . GERD (gastroesophageal reflux disease)     nexium-instructed to take dos  . Depression   . Irritable bowel   . Interstitial cystitis   . Migraine     migraines-treated with inderal along with treatment for htn  . Chronic neck pain   . DDD (degenerative disc disease), cervical   . Liver nodule STABLE BENIGN FOCAL HYPERPLASIA NODULAR LEFT HEPATIC LOBE    PER MRI  01-17-2012, last MRI recommended in April 2015, is stable no further workup will be needed   . History of head injury 2012-- LOC BUT NO RESIDUALS (PT RACES GO-CARTS)  . Frequency of urination   . Urgency of urination   . Nocturia   . Right arm weakness SECONARDAY TO CERVICAL  FUSION  . Anxiety   . Anxiety attack   . Hemorrhoids   . Collagen vascular disease   . Diabetes mellitus without complication    Past Surgical History  Procedure Laterality Date  . Knee arthroscopy  1992  &  1988    bilateral  . Ectopic pregnancy surgery  1993  . Tonsillectomy    . Laparoscopy  01/04/2011    Procedure: LAPAROSCOPY DIAGNOSTIC;  Surgeon: Arloa Koh;  Location: [facility];  Service: Gynecology;  Laterality: N/A;  Bilateral salpingoectomy/ LYSIS ADHESIONS  . Cysto/  hod  01-26-2010 ;  09-01-2003 ;   DR NESI    CHRONIC I.C.  . Anterior cervical decomp/discectomy fusion  04-25-2005    C6 - 7  . Right ureteroscopic stone extraction  05-31-2002  . Laparoscopic cholecystectomy  11-02-2001  . Vaginal hysterectomy  07-05-2001  . Cysto with hydrodistension  01/20/2012    Procedure: CYSTOSCOPY/HYDRODISTENSION;  Surgeon: Hanley Ben, MD;  Location: [facility];  Service: Urology;  Laterality: N/A;  Bladder instillation of Marcaine and Pyridium   . Esophagogastroduodenoscopy  02/15/2007    Dr. Gala Romney- noncritical subtle schatzki's ring, o/w normal. tiny hiatal hernia o/w normal stomach  . Esophagogastroduodenoscopy  06/18/2007    Dr. Vivi Ferns esophagus, stomach, D1 and D2  . Bravo ph study  06/18/2007    Dr. Gala Romney- study indicates extremely good control of gastroesophageal reflux while on  acid supppression therapy.  . Esophagogastroduodenoscopy (egd) with esophageal dilation  05/07/2012    Rourk-small HH, benign esophageal bx, Maloney dilation  . Colonoscopy N/A 06/18/2012    [id number] anal canal hemorrhoid/Tiny colonic ulcers (benign likely related to aspirin plus or minus Elmiron)   Family History  Problem Relation Age of Onset  . Pancreatic cancer Maternal Grandmother   . Diabetes Mother   . Heart disease Father   . Diabetes Sister    History  Substance Use Topics  . Smoking status: Never Smoker   . Smokeless tobacco: Never Used  . Alcohol Use: No    OB History   Grav Para Term Preterm Abortions TAB SAB Ect Mult Living                 Review of Systems  All other systems reviewed and are negative.     Allergies  Morphine  Home Medications   Prior to Admission medications   Medication Sig Start Date End Date Taking? Authorizing Provider  ALPRAZolam Duanne Moron) 0.5 MG tablet Take 0.5 mg by mouth 3 (three) times daily as needed for sleep.    Historical Provider, MD  amitriptyline (ELAVIL) 25 MG tablet Take 25 mg by mouth at bedtime.    Historical Provider, MD  cephALEXin (KEFLEX) 500 MG capsule Take 1 capsule (500 mg total) by mouth 4 (four) times daily. 09/11/13   [name], MD  ferrous sulfate 325 (65 FE) MG EC tablet Take 325 mg by mouth 2 (two) times daily.    Historical Provider, MD  gabapentin (NEURONTIN) 300 MG capsule Take 300 mg by mouth 3 (three) times daily.    Historical Provider, MD  HYDROcodone-acetaminophen (NORCO/VICODIN) 5-325 MG per tablet Take 2 tablets by mouth every 4 (four) hours as needed. 09/11/13   [name], MD  hyoscyamine (LEVSIN SL) 0.125 MG SL tablet Place 0.125 mg under the tongue every 4 (four) hours as needed for cramping.     Historical Provider, MD  ibuprofen (ADVIL,MOTRIN) 200 MG tablet Take 400-600 mg by mouth every 8 (eight) hours as needed for mild pain, moderate pain or cramping. For pain.    Historical Provider, MD  Linaclotide Rolan Lipa) 145 MCG CAPS capsule Take 1 capsule (145 mcg total) by mouth daily. 30 minutes before breakfast 02/14/13   Orvil Feil, NP  methylPREDNISolone (MEDROL DOSEPAK) 4 MG tablet Use as directed 03/12/13   Kalman Drape, MD  naproxen (NAPROSYN) 500 MG tablet Take 1 tablet (500 mg total) by mouth 2 (two) times daily with a meal. 09/11/13   [name], MD  NEXIUM 40 MG capsule TAKE (1) CAPSULE BY MOUTH TWICE DAILY BEFORE A MEAL. 08/27/13   Orvil Feil, NP  ondansetron (ZOFRAN) 4 MG tablet Take 1 tablet (4 mg total) by mouth every 8 (eight) hours as needed for nausea.  01/31/13   Mahala Menghini, PA-C  oxybutynin (DITROPAN) 5 MG tablet Take 1 tablet (5 mg total) by mouth 3 (three) times daily as needed. 01/13/13   Nimish Luther Parody, MD  oxyCODONE-acetaminophen (PERCOCET) 5-325 MG per tablet Take 1 tablet by mouth every 4 (four) hours as needed. For pain.    Historical Provider, MD  pentosan polysulfate (ELMIRON) 100 MG capsule Take 100 mg by mouth 2 (two) times daily.    Historical Provider, MD  Phenylephrine-DM-GG (MUCINEX FAST-MAX CONGEST COUGH) 2.5-5-100 MG/5ML LIQD Take 30 mLs by mouth every 6 (six) hours as needed (congestion/cough).    Historical Provider, MD  potassium chloride (K-DUR) 10 MEQ tablet Take 10 mEq by mouth 2 (two) times daily.    Historical Provider, MD  PRESCRIPTION MEDICATION Irrigate with 15 mLs as directed daily as needed (bladder pain). Each 76ml includes: 50,000 units heparin/ 200mg  lidocaine.  PATIENT GETS PREMIXED FROM [facility] [place] (PH# [phone]).    Historical Provider, MD  promethazine (PHENERGAN) 25 MG tablet Take 1 tablet (25 mg total) by mouth every 6 (six) hours as needed for nausea or vomiting. 09/11/13   [name], MD  propranolol (INDERAL) 20 MG tablet Take 20 mg by mouth 2 (two) times daily.     Historical Provider, MD   BP 154/100  Pulse 110  Temp(Src) 99.5 F (37.5 C) (Oral)  Resp 20  Ht 5\' 5"  (1.651 m)  Wt 145 lb (65.772 kg)  BMI 24.13 kg/m2  SpO2 98% Physical Exam  Nursing note and vitals reviewed. Constitutional: She is oriented to person, place, and time. She appears well-developed and well-nourished. No distress.  HENT:  Head: Normocephalic and atraumatic.  Eyes: Conjunctivae and EOM are normal.  Neck: Normal range of motion. Neck supple. No tracheal deviation present.  Cardiovascular: Normal rate and normal heart sounds.   Heart rate of 100.  Pulmonary/Chest: Effort normal and breath sounds normal. No respiratory distress.  Abdominal: Soft. Bowel sounds are normal. There  is tenderness. There is no guarding.  Tenderness to palpation over right side. Minimal suprapubic tenderness. No peritoneal signs.  Musculoskeletal: Normal range of motion.  Neurological: She is alert and oriented to person, place, and time.  Skin: Skin is warm and dry. No rash noted.  Psychiatric: She has a normal mood and affect. Her behavior is normal.    ED Course  Procedures (including critical care time) DIAGNOSTIC STUDIES: Oxygen Saturation is 98% on room air, normal by my interpretation.    COORDINATION OF CARE: 12:47 AM- Treatment plan was discussed with patient who verbalizes understanding and agrees.     Labs Review Labs Reviewed  URINALYSIS, ROUTINE W REFLEX MICROSCOPIC - Abnormal; Notable for the following:    Hgb urine dipstick TRACE (*)    Leukocytes, UA TRACE (*)    All other components within normal limits  URINE MICROSCOPIC-ADD ON - Abnormal; Notable for the following:    Squamous Epithelial / LPF FEW (*)    Bacteria, UA MANY (*)    All other components within normal limits  URINE CULTURE    Imaging Review Ct Abdomen Pelvis Wo Contrast  09/11/2013   CLINICAL DATA:  Right flank pain  EXAM: CT ABDOMEN AND PELVIS WITHOUT CONTRAST  TECHNIQUE: Multidetector CT imaging of the abdomen and pelvis was performed following the standard protocol without IV contrast.  COMPARISON:  None.  FINDINGS: BODY WALL: Unremarkable.  LOWER CHEST: Unremarkable.  ABDOMEN/PELVIS:  Liver: No focal abnormality.  Biliary: Cholecystectomy, accounting for mild biliary ductal enlargement.  Pancreas: Unremarkable.  Spleen: Unremarkable.  Adrenals: Unremarkable.  Kidneys and ureters: 3 mm nonobstructing stone in the lower pole left kidney. No hydronephrosis or ureteral calculus.  Bladder: Unremarkable.  Reproductive: Hysterectomy.  Unremarkable right ovary.  Bowel: Moderate volume of formed stool. No bowel obstruction. Negative appendix.  Retroperitoneum: No mass or adenopathy.  Peritoneum: No free  fluid or gas.  Vascular: No acute abnormality.  OSSEOUS: Large L1 hemangioma, replacing the body marrow and extending into the right pedicle. No acute osseous findings. Chronic T12 superior endplate Schmorl's node.  IMPRESSION: 1. No hydronephrosis or ureteral calculus. 2. Nonobstructive left  nephrolithiasis.   Electronically Signed   By: Jorje Guild M.D.   On: 09/11/2013 02:50       MDM   Final diagnoses:  Flank pain  Interstitial cystitis    The pt has had much improved sx after meds as below - she has soft signs of UTI - with IC, this may be chronic colonization, no other CT findings concerning for a more pathologic diagnosis.  No fever here, normal pulse on multiple reexaminations by myself.  Informed pt of results - stable for d/c.  Meds given in ED:  Medications  ketorolac (TORADOL) 30 MG/ML injection 30 mg (30 mg Intravenous Given 09/11/13 0101)  sodium chloride 0.9 % bolus 1,000 mL (0 mLs Intravenous Stopped 09/11/13 0202)  ondansetron (ZOFRAN) injection 4 mg (4 mg Intravenous Given 09/11/13 0101)  cefTRIAXone (ROCEPHIN) 1 g in dextrose 5 % 50 mL IVPB (0 g Intravenous Stopped 09/11/13 0304)    New Prescriptions   CEPHALEXIN (KEFLEX) 500 MG CAPSULE    Take 1 capsule (500 mg total) by mouth 4 (four) times daily.   HYDROCODONE-ACETAMINOPHEN (NORCO/VICODIN) 5-325 MG PER TABLET    Take 2 tablets by mouth every 4 (four) hours as needed.   NAPROXEN (NAPROSYN) 500 MG TABLET    Take 1 tablet (500 mg total) by mouth 2 (two) times daily with a meal.   PROMETHAZINE (PHENERGAN) 25 MG TABLET    Take 1 tablet (25 mg total) by mouth every 6 (six) hours as needed for nausea or vomiting.      I personally performed the services described in this documentation, which was scribed in my presence. The recorded information has been reviewed and is accurate.      [name], MD 09/11/13 [phone]

## 2013-09-14 LAB — URINE CULTURE: Colony Count: >=100000

## 2013-09-15 ENCOUNTER — Telehealth (HOSPITAL_BASED_OUTPATIENT_CLINIC_OR_DEPARTMENT_OTHER): Payer: Self-pay | Admitting: Emergency Medicine

## 2013-09-15 NOTE — Telephone Encounter (Signed)
Post ED Visit - Positive Culture Follow-up  Culture report reviewed by antimicrobial stewardship pharmacist: []  Wes [place], Pharm.D., BCPS [x]  Heide Guile, Pharm.D., BCPS []  Alycia Rossetti, Pharm.D., BCPS []  [place], [place].D., BCPS, AAHIVP []  Legrand Como, Pharm.D., BCPS, AAHIVP []  Juliene Pina, Pharm.D.  Positive urine culture Treated with Keflex, organism sensitive to the same and no further patient follow-up is required at this time.  Myrna Blazer 09/15/2013, 5:23 PM

## 2013-12-03 ENCOUNTER — Ambulatory Visit (INDEPENDENT_AMBULATORY_CARE_PROVIDER_SITE_OTHER): Payer: BC Managed Care – PPO | Admitting: Gastroenterology

## 2013-12-03 ENCOUNTER — Encounter: Payer: Self-pay | Admitting: Gastroenterology

## 2013-12-03 VITALS — BP 124/80 | HR 86 | Temp 97.3°F | Ht 65.0 in | Wt 147.4 lb

## 2013-12-03 DIAGNOSIS — R1310 Dysphagia, unspecified: Secondary | ICD-10-CM

## 2013-12-03 DIAGNOSIS — R1319 Other dysphagia: Secondary | ICD-10-CM

## 2013-12-03 DIAGNOSIS — K625 Hemorrhage of anus and rectum: Secondary | ICD-10-CM

## 2013-12-03 DIAGNOSIS — R1314 Dysphagia, pharyngoesophageal phase: Secondary | ICD-10-CM

## 2013-12-03 DIAGNOSIS — K5900 Constipation, unspecified: Secondary | ICD-10-CM

## 2013-12-03 DIAGNOSIS — D649 Anemia, unspecified: Secondary | ICD-10-CM

## 2013-12-03 LAB — CBC WITH DIFFERENTIAL/PLATELET
Basophils Absolute: 0 10*3/uL (ref 0.0–0.1)
Basophils Relative: 0 % (ref 0–1)
Eosinophils Absolute: 0.1 10*3/uL (ref 0.0–0.7)
Eosinophils Relative: 1 % (ref 0–5)
HCT: 32.8 % — ABNORMAL LOW (ref 36.0–46.0)
HEMOGLOBIN: 10.8 g/dL — AB (ref 12.0–15.0)
LYMPHS ABS: 1.2 10*3/uL (ref 0.7–4.0)
Lymphocytes Relative: 11 % — ABNORMAL LOW (ref 12–46)
MCH: 29.4 pg (ref 26.0–34.0)
MCHC: 32.9 g/dL (ref 30.0–36.0)
MCV: 89.4 fL (ref 78.0–100.0)
MONOS PCT: 6 % (ref 3–12)
Monocytes Absolute: 0.6 10*3/uL (ref 0.1–1.0)
NEUTROS ABS: 8.8 10*3/uL — AB (ref 1.7–7.7)
Neutrophils Relative %: 82 % — ABNORMAL HIGH (ref 43–77)
Platelets: 249 10*3/uL (ref 150–400)
RBC: 3.67 MIL/uL — ABNORMAL LOW (ref 3.87–5.11)
RDW: 13.9 % (ref 11.5–15.5)
WBC: 10.7 10*3/uL — ABNORMAL HIGH (ref 4.0–10.5)

## 2013-12-03 MED ORDER — LUBIPROSTONE 24 MCG PO CAPS: 24.0000 ug | ORAL_CAPSULE | Freq: Two times a day (BID) | ORAL | Status: DC

## 2013-12-03 NOTE — Assessment & Plan Note (Addendum)
Dysphagia predominantly to pills. Last EGD 2014. Recommend BPE to evaluate for stricture, esophageal motility issues, compression related to recent neck surgery.

## 2013-12-03 NOTE — Patient Instructions (Signed)
1. Try amitiza one capsule with breakfast and one with evening meal. This may not be on your formulary, if not we will try filling about prior authorization form. Use rebate card provided. 2. Esophagram as scheduled. Then we will decide about capsule study.  3. Please have your labs done.

## 2013-12-03 NOTE — Assessment & Plan Note (Signed)
H/O IDA. Incomplete work-up as outlined. Reevaluate CBC at this time. Likely Given's in near future. Await BPE to make sure no significant stricture before swallowing capsule.

## 2013-12-03 NOTE — Progress Notes (Signed)
Primary Care Physician: Glo Herring., MD  Primary Gastroenterologist:  Garfield Cornea, MD   Chief Complaint  Patient presents with  . Dysphagia  . Constipation    HPI: HENRI GUEDES is a 43 y.o. female here for follow up. Last seen in 02/2013. H/O IBS, chronic abdominal pain, interstitial cystitis, intermittent rectal bleeding. GES normal. Colonoscopy and EGD 2014 showed small HH, benign esophageal bx, thrombosed anal canal hemorrhoid, tiny nonspecific colonic ulcers possibly related to ASA +/- Elmiron. She had to cancel SB Givens due to need for neck surgery.   Presents back with request to complete Givens. Also complains of problems swallowing pills. +early satiety. No difficulty swallowing chicken. Diet very limited due to nausea. Takes phnergan every morning. BMs predominantly constipated. Some brbpr. Linzess just made her feel bloated.    Sometimes bowels move and sometimes not, rb. Yellow mucous. Phenergan. Wake up every morning with nausea. Little amt of blood. More constipated. linzess made stomach feel bloating. Never tried [place]. Bad taste in mouth every morning. Problems swallowing pills. Cannot eat whole  Meal. Chicken goes down. Early satiety.   Scheduled for bladder surgery this Friday for IC.   Current Outpatient Prescriptions  Medication Sig Dispense Refill  . ALPRAZolam (XANAX) 0.5 MG tablet Take 0.5 mg by mouth 3 (three) times daily as needed for sleep.      Marland Kitchen amitriptyline (ELAVIL) 25 MG tablet Take 25 mg by mouth at bedtime.      . gabapentin (NEURONTIN) 300 MG capsule Take 300 mg by mouth 3 (three) times daily.      . hyoscyamine (LEVSIN SL) 0.125 MG SL tablet Place 0.125 mg under the tongue every 4 (four) hours as needed for cramping.       Marland Kitchen ibuprofen (ADVIL,MOTRIN) 200 MG tablet Take 400-600 mg by mouth every 8 (eight) hours as needed for mild pain, moderate pain or cramping. For pain.      Marland Kitchen NEXIUM 40 MG capsule TAKE (1) CAPSULE BY MOUTH TWICE DAILY  BEFORE A MEAL.  60 capsule  3  . ondansetron (ZOFRAN) 4 MG tablet Take 1 tablet (4 mg total) by mouth every 8 (eight) hours as needed for nausea.  30 tablet  1  . oxyCODONE-acetaminophen (PERCOCET) 5-325 MG per tablet Take 1 tablet by mouth every 4 (four) hours as needed. For pain.      . pentosan polysulfate (ELMIRON) 100 MG capsule Take 100 mg by mouth 2 (two) times daily.      . potassium chloride (K-DUR) 10 MEQ tablet Take 10 mEq by mouth 2 (two) times daily.      Marland Kitchen PRESCRIPTION MEDICATION Irrigate with 15 mLs as directed daily as needed (bladder pain). Each 57ml includes: 50,000 units heparin/ 200mg  lidocaine.  PATIENT GETS PREMIXED FROM [facility] [place] (PH# [phone]).      . propranolol (INDERAL) 20 MG tablet Take 20 mg by mouth 2 (two) times daily.       Marland Kitchen sulfamethoxazole-trimethoprim (BACTRIM DS) 800-160 MG per tablet Take 1 tablet by mouth daily.       Marland Kitchen lubiprostone (AMITIZA) 24 MCG capsule Take 1 capsule (24 mcg total) by mouth 2 (two) times daily with a meal.  60 capsule  3  . NUCYNTA 50 MG TABS tablet 50 mg 4 (four) times daily.      . promethazine (PHENERGAN) 25 MG tablet 25 mg 2 (two) times daily as needed.       No current facility-administered medications  for this visit.    Allergies as of 12/03/2013 - Review Complete 12/03/2013  Allergen Reaction Noted  . Morphine Rash     ROS:  General: Negative for anorexia, weight loss, fever, chills, fatigue, weakness. ENT: Negative for hoarseness, nasal congestion. CV: Negative for chest pain, angina, palpitations, dyspnea on exertion, peripheral edema.  Respiratory: Negative for dyspnea at rest, dyspnea on exertion, cough, sputum, wheezing.  GI: See history of present illness. GU:  Negative for hematuria, urinary incontinence. +dysuria, urinary frequency, nocturnal urination.  Endo: Negative for unusual weight change.    Physical Examination:   BP 124/80  Pulse 86  Temp(Src) 97.3 F (36.3 C)  (Oral)  Ht 5\' 5"  (1.651 m)  Wt 147 lb 6.4 oz (66.86 kg)  BMI 24.53 kg/m2  General: Well-nourished, well-developed in no acute distress.  Eyes: No icterus. Mouth: Oropharyngeal mucosa moist and pink , no lesions erythema or exudate. Lungs: Clear to auscultation bilaterally.  Heart: Regular rate and rhythm, no murmurs rubs or gallops.  Abdomen: Bowel sounds are normal, nontender, nondistended, no hepatosplenomegaly or masses, no abdominal bruits or hernia , no rebound or guarding.   Extremities: No lower extremity edema. No clubbing or deformities. Neuro: Alert and oriented x 4   Skin: Warm and dry, no jaundice.   Psych: Alert and cooperative, normal mood and affect.  Imaging Studies:  CT A/P without contrast 09/11/13  IMPRESSION:  1. No hydronephrosis or ureteral calculus.  2. Nonobstructive left nephrolithiasis.

## 2013-12-03 NOTE — Assessment & Plan Note (Signed)
Trial of Amitiza 64mcg BID. Previously failed Linzess.

## 2013-12-04 NOTE — Progress Notes (Signed)
cc'd to pcp 

## 2013-12-10 NOTE — Progress Notes (Signed)
Quick Note:  Anemia persists. Hgb slightly better. Once esophagram complete, we will arrange for Givens capsule. I want to make sure she doesn't have significant esophageal stricture. ______

## 2013-12-17 ENCOUNTER — Ambulatory Visit (HOSPITAL_COMMUNITY)
Admission: RE | Admit: 2013-12-17 | Discharge: 2013-12-17 | Disposition: A | Discharge status: Home / Self Care | Payer: BC Managed Care – PPO | Source: Ambulatory Visit | Attending: Gastroenterology | Admitting: Gastroenterology

## 2013-12-17 DIAGNOSIS — R1319 Other dysphagia: Secondary | ICD-10-CM

## 2013-12-17 DIAGNOSIS — R1310 Dysphagia, unspecified: Secondary | ICD-10-CM | POA: Diagnosis present

## 2013-12-17 DIAGNOSIS — K625 Hemorrhage of anus and rectum: Secondary | ICD-10-CM

## 2013-12-17 DIAGNOSIS — K5900 Constipation, unspecified: Secondary | ICD-10-CM

## 2013-12-17 DIAGNOSIS — K219 Gastro-esophageal reflux disease without esophagitis: Secondary | ICD-10-CM | POA: Insufficient documentation

## 2013-12-17 DIAGNOSIS — D649 Anemia, unspecified: Secondary | ICD-10-CM

## 2013-12-18 NOTE — Progress Notes (Signed)
Quick Note:  No esophageal motility issues or stricture. Mild GERD.  OK to schedule Given's capsule endoscopy for dx: IDA.  I think patient was trying to get done before end of Sept??? For insurance issues. Please ask patient and try to accommodate. ______

## 2013-12-20 ENCOUNTER — Other Ambulatory Visit: Payer: Self-pay | Admitting: Internal Medicine

## 2013-12-23 NOTE — Progress Notes (Signed)
Quick Note:  Check with Rosendo Gros but I am okay with you scheduling at anytime. I will read on my own time and then you can block me the time on 10/13 to make up for it (two appt spots). ______

## 2013-12-23 NOTE — Progress Notes (Signed)
Quick Note:  Colleen Duncan, can the patient wait until 10/13 from a medical standpoint? If so, let's schedule the read on that day. Thanks! ______

## 2013-12-27 ENCOUNTER — Other Ambulatory Visit: Payer: Self-pay

## 2013-12-27 DIAGNOSIS — D509 Iron deficiency anemia, unspecified: Secondary | ICD-10-CM

## 2013-12-30 ENCOUNTER — Other Ambulatory Visit: Payer: Self-pay

## 2013-12-30 ENCOUNTER — Telehealth: Payer: Self-pay | Admitting: Internal Medicine

## 2013-12-30 NOTE — Telephone Encounter (Signed)
Pt called to speak with LAL, but I told her that LAL is no longer with [name] and that I would have GF call her back. [phone]

## 2013-12-30 NOTE — Telephone Encounter (Signed)
Talked with patient this morning

## 2013-12-31 ENCOUNTER — Encounter (HOSPITAL_COMMUNITY): Payer: Self-pay | Admitting: Pharmacy Technician

## 2014-01-01 ENCOUNTER — Encounter (HOSPITAL_COMMUNITY)
Admission: RE | Disposition: A | Discharge status: Home / Self Care | Payer: Self-pay | Source: Ambulatory Visit | Attending: Internal Medicine

## 2014-01-01 ENCOUNTER — Ambulatory Visit (HOSPITAL_COMMUNITY)
Admission: RE | Admit: 2014-01-01 | Discharge: 2014-01-01 | Disposition: A | Discharge status: Home / Self Care | Payer: BC Managed Care – PPO | Source: Ambulatory Visit | Attending: Internal Medicine | Admitting: Internal Medicine

## 2014-01-01 DIAGNOSIS — K625 Hemorrhage of anus and rectum: Secondary | ICD-10-CM

## 2014-01-01 DIAGNOSIS — D509 Iron deficiency anemia, unspecified: Secondary | ICD-10-CM | POA: Diagnosis present

## 2014-01-01 HISTORY — PX: GIVENS CAPSULE STUDY: SHX5432

## 2014-01-01 SURGERY — IMAGING PROCEDURE, GI TRACT, INTRALUMINAL, VIA CAPSULE

## 2014-01-01 NOTE — Telephone Encounter (Signed)
Pt's insurances will cover her GIVENS the approval number is [id number].

## 2014-01-03 ENCOUNTER — Encounter (HOSPITAL_COMMUNITY): Payer: Self-pay | Admitting: Internal Medicine

## 2014-01-03 NOTE — Op Note (Signed)
Small Bowel Givens Capsule Study Procedure date:  01/01/14  Referring Provider:  Garfield Cornea, MD  PCP:  Dr. Glo Herring., MD  Indication for procedure:  IDA. Colonoscopy and EGD 2014 showed small HH, benign esophageal bx, thrombosed anal canal hemorrhoid, tiny nonspecific colonic ulcers possibly related to ASA +/- Elmiron. S/p hysterectomy. No other sources for blood loss.  Patient data:  Wt: 145 lb Ht: 65 inches Waist: 32 inches  Findings:  Patient swallowed capsule without difficulty. Small bowel completely imaged with adequate preparation. Normal appearing small bowel.   First Gastric image:  40 sec First Duodenal image: 15 min 2 sec First Ileo-Cecal Valve image: 5 hr 11 min 37 sec First Cecal image: 5 hr 11 min 43 sec Gastric Passage time: 14 min Small Bowel Passage time:  4 hr 56 min  Summary & Recommendations: Normal appearing small bowel. No explanation for IDA. Recommend ferrous sulfate 325mg  po BID. Repeat CBC, ferritin in 8 weeks.   Study reviewed with Dr. Gala Romney.

## 2014-01-07 NOTE — Op Note (Signed)
Attending note:  Agree with above. Pertinent images were reviewed personally by me.

## 2014-02-24 ENCOUNTER — Telehealth: Payer: Self-pay | Admitting: Internal Medicine

## 2014-02-24 NOTE — Telephone Encounter (Signed)
Patient called inquiring about test results from September

## 2014-02-25 ENCOUNTER — Other Ambulatory Visit: Payer: Self-pay

## 2014-02-25 ENCOUNTER — Other Ambulatory Visit: Payer: Self-pay | Admitting: Gastroenterology

## 2014-02-25 ENCOUNTER — Telehealth: Payer: Self-pay | Admitting: Internal Medicine

## 2014-02-25 DIAGNOSIS — D509 Iron deficiency anemia, unspecified: Secondary | ICD-10-CM

## 2014-02-25 NOTE — Telephone Encounter (Signed)
PATIENT CALLED AGAIN INQUIRING ABOUT TEST RESULTS

## 2014-02-25 NOTE — Telephone Encounter (Signed)
I called [facility] about results. Results were never routed to me to inform pt. Pt is aware of her results and to take ferrous sulfate 325mg  bid and to repeat bloodwork in 6 weeks. Lab order on file.

## 2014-02-25 NOTE — Telephone Encounter (Signed)
I spoke with the pt.  

## 2014-02-26 NOTE — Telephone Encounter (Signed)
agree

## 2014-02-27 NOTE — Telephone Encounter (Addendum)
Called to apologize to patient about delay in test results. Voiced understanding.   Concerned about ongoing nausea. Living off of phenergan 3-4 per day due to nausea. Bladder surgery in 11/2013. Changed diet. No soft drink since then. Has not worked this week due to N/V. Wakes up with it every day. Gets woke up with bladder pain between 1-3 am. Lately, BMs very loose. Vomiting once every 2 weeks. zofran doesn't work. Reports she does not seem to be digesting her meds consistently. States Uribel usually turns her urine blue but normal color of urine for 3 days before blue colored returned. Previous GES normal 1 year ago.    To discuss with Dr. Gala Romney regarding ongoing nausea.

## 2014-03-05 NOTE — Telephone Encounter (Signed)
Dr. Gala Romney, patient continues to have significant nausea, 3-4 phenergan daily. Significant issues with interstitial cystitis. Zofran does not help. Thinks she is not digesting medication, see below. GES normal 1 year ago.  Do you think she would benefit from referral to [facility]? Can you think of anything else to offer locally?  Colonoscopy and EGD 2014 showed small HH, benign esophageal bx, thrombosed anal canal hemorrhoid, tiny nonspecific colonic ulcers possibly related to ASA +/- Elmiron. SB Givens unremarkable. H/O IDA.

## 2014-03-06 ENCOUNTER — Telehealth: Payer: Self-pay | Admitting: Internal Medicine

## 2014-03-06 NOTE — Telephone Encounter (Signed)
I called patient back today ([phone])  and left a message. Chart reviewed briefly. May benefit from a trial of Marinol 2.5 mg twice daily.  Agree with need for further evaluation of nausea. Tertiary referral may ultimately be needed. Some of her medications and her coexisting medical condition (interstitial cystitis) he may be contributing factors.

## 2014-03-10 ENCOUNTER — Telehealth: Payer: Self-pay | Admitting: Internal Medicine

## 2014-03-10 NOTE — Telephone Encounter (Signed)
Patient called this morning saying she had just now got a message from Thursday that RMR had called her.  I told her RMR and his nurse were not in the office today and I would have his nurse call her tomorrow. She asked for her to call this number [phone]

## 2014-03-12 MED ORDER — DRONABINOL 2.5 MG PO CAPS: 2.5000 mg | ORAL_CAPSULE | Freq: Two times a day (BID) | ORAL | Status: DC

## 2014-03-12 NOTE — Telephone Encounter (Signed)
I spoke with the pt today. Please see phone note.

## 2014-03-12 NOTE — Telephone Encounter (Signed)
Colleen Duncan, see telephone note from 03/10/14. Per RMRs recommendation as noted below: Let's do short trial of Marinol 2.5mg  PO BID for nausea. RX sent.   Request patient to try to back off phenergan while on marinol.  Referral to [facility] for chronic nausea/vomiting. ?Dr. Derrill Kay if available.

## 2014-03-12 NOTE — Addendum Note (Signed)
Addended by: Mahala Menghini on: 03/12/2014 08:33 AM   Modules accepted: Orders

## 2014-03-12 NOTE — Telephone Encounter (Signed)
Pt is aware. rx faxed to [facility].  Ok to refer to [facility].  please refer pt.

## 2014-03-13 ENCOUNTER — Other Ambulatory Visit: Payer: Self-pay

## 2014-03-13 DIAGNOSIS — R112 Nausea with vomiting, unspecified: Secondary | ICD-10-CM

## 2014-03-13 NOTE — Telephone Encounter (Signed)
Referral has been faxed.

## 2014-03-17 NOTE — Telephone Encounter (Signed)
NO ADDITIONAL NOTES

## 2014-03-26 ENCOUNTER — Telehealth: Payer: Self-pay

## 2014-03-26 NOTE — Telephone Encounter (Signed)
pts sister called again today wanting to know if and when pt can be seen sooner. States pt is out of work again due to being sick.

## 2014-03-26 NOTE — Telephone Encounter (Signed)
[facility] and spoke with office staff and they put pt on wait list so she can hopefully be seen sooner than her appointment in March. Called Pt and made her aware.

## 2014-03-26 NOTE — Telephone Encounter (Signed)
Colleen Duncan has not been seen in our office in months. When she was last seen, I see no mention of nausea in  notes. In addition, she has been started on Amitiza since her office visit. Colleen could have something to do with her nausea. Colleen Duncan needs an office visit with an extender -   Expedited. If she is still taking Amitiza, would consider stopping it, at least temporarily.

## 2014-03-26 NOTE — Telephone Encounter (Signed)
Colleen Duncan, please get this pt in asap and call her. Thanks.

## 2014-03-26 NOTE — Telephone Encounter (Signed)
[facility] CALLING [facility] TO SEE IF THEY CAN SEE PATIENT SOONER

## 2014-08-01 ENCOUNTER — Other Ambulatory Visit: Payer: Self-pay | Admitting: Rehabilitation

## 2014-08-01 DIAGNOSIS — M5002 Cervical disc disorder with myelopathy, mid-cervical region, unspecified level: Secondary | ICD-10-CM

## 2014-08-03 ENCOUNTER — Other Ambulatory Visit: Payer: Self-pay | Admitting: Gastroenterology

## 2014-08-04 ENCOUNTER — Ambulatory Visit
Admission: RE | Admit: 2014-08-04 | Discharge: 2014-08-04 | Disposition: A | Discharge status: Home / Self Care | Payer: BLUE CROSS/BLUE SHIELD | Source: Ambulatory Visit | Attending: Rehabilitation | Admitting: Rehabilitation

## 2014-08-04 DIAGNOSIS — M5002 Cervical disc disorder with myelopathy, mid-cervical region, unspecified level: Secondary | ICD-10-CM

## 2014-08-04 MED ORDER — GADOBENATE DIMEGLUMINE 529 MG/ML IV SOLN
14.0000 mL | Freq: Once | INTRAVENOUS | Status: AC | PRN
  Administered 2014-08-04: 14 mL via INTRAVENOUS

## 2014-10-14 ENCOUNTER — Encounter: Payer: Self-pay | Admitting: *Deleted

## 2014-10-29 ENCOUNTER — Encounter: Payer: Self-pay | Admitting: Cardiovascular Disease

## 2014-11-20 ENCOUNTER — Emergency Department (HOSPITAL_COMMUNITY)
Admission: EM | Admit: 2014-11-20 | Discharge: 2014-11-20 | Disposition: A | Discharge status: Home / Self Care | Payer: BLUE CROSS/BLUE SHIELD | Attending: Emergency Medicine | Admitting: Emergency Medicine

## 2014-11-20 ENCOUNTER — Emergency Department (HOSPITAL_COMMUNITY): Payer: BLUE CROSS/BLUE SHIELD

## 2014-11-20 ENCOUNTER — Encounter (HOSPITAL_COMMUNITY): Payer: Self-pay | Admitting: Emergency Medicine

## 2014-11-20 DIAGNOSIS — W19XXXA Unspecified fall, initial encounter: Secondary | ICD-10-CM

## 2014-11-20 DIAGNOSIS — Y9389 Activity, other specified: Secondary | ICD-10-CM | POA: Insufficient documentation

## 2014-11-20 DIAGNOSIS — W108XXA Fall (on) (from) other stairs and steps, initial encounter: Secondary | ICD-10-CM | POA: Diagnosis not present

## 2014-11-20 DIAGNOSIS — F329 Major depressive disorder, single episode, unspecified: Secondary | ICD-10-CM | POA: Insufficient documentation

## 2014-11-20 DIAGNOSIS — S0083XA Contusion of other part of head, initial encounter: Secondary | ICD-10-CM | POA: Diagnosis not present

## 2014-11-20 DIAGNOSIS — Z87828 Personal history of other (healed) physical injury and trauma: Secondary | ICD-10-CM | POA: Diagnosis not present

## 2014-11-20 DIAGNOSIS — Y998 Other external cause status: Secondary | ICD-10-CM | POA: Insufficient documentation

## 2014-11-20 DIAGNOSIS — Z8679 Personal history of other diseases of the circulatory system: Secondary | ICD-10-CM | POA: Diagnosis not present

## 2014-11-20 DIAGNOSIS — Z79899 Other long term (current) drug therapy: Secondary | ICD-10-CM | POA: Insufficient documentation

## 2014-11-20 DIAGNOSIS — F419 Anxiety disorder, unspecified: Secondary | ICD-10-CM | POA: Diagnosis not present

## 2014-11-20 DIAGNOSIS — S82832A Other fracture of upper and lower end of left fibula, initial encounter for closed fracture: Secondary | ICD-10-CM | POA: Insufficient documentation

## 2014-11-20 DIAGNOSIS — I10 Essential (primary) hypertension: Secondary | ICD-10-CM | POA: Insufficient documentation

## 2014-11-20 DIAGNOSIS — K589 Irritable bowel syndrome without diarrhea: Secondary | ICD-10-CM | POA: Insufficient documentation

## 2014-11-20 DIAGNOSIS — E119 Type 2 diabetes mellitus without complications: Secondary | ICD-10-CM | POA: Diagnosis not present

## 2014-11-20 DIAGNOSIS — S82402A Unspecified fracture of shaft of left fibula, initial encounter for closed fracture: Secondary | ICD-10-CM

## 2014-11-20 DIAGNOSIS — S3992XA Unspecified injury of lower back, initial encounter: Secondary | ICD-10-CM | POA: Insufficient documentation

## 2014-11-20 DIAGNOSIS — Y9289 Other specified places as the place of occurrence of the external cause: Secondary | ICD-10-CM | POA: Diagnosis not present

## 2014-11-20 DIAGNOSIS — S99912A Unspecified injury of left ankle, initial encounter: Secondary | ICD-10-CM | POA: Diagnosis present

## 2014-11-20 DIAGNOSIS — G8929 Other chronic pain: Secondary | ICD-10-CM | POA: Insufficient documentation

## 2014-11-20 DIAGNOSIS — Z87448 Personal history of other diseases of urinary system: Secondary | ICD-10-CM | POA: Diagnosis not present

## 2014-11-20 DIAGNOSIS — Z792 Long term (current) use of antibiotics: Secondary | ICD-10-CM | POA: Diagnosis not present

## 2014-11-20 MED ORDER — OXYCODONE-ACETAMINOPHEN 5-325 MG PO TABS
1.0000 | ORAL_TABLET | Freq: Once | ORAL | Status: AC
  Administered 2014-11-20: 1 via ORAL
  Filled 2014-11-20: qty 1

## 2014-11-20 MED ORDER — IBUPROFEN 600 MG PO TABS: 600.0000 mg | ORAL_TABLET | Freq: Four times a day (QID) | ORAL | Status: DC | PRN

## 2014-11-20 NOTE — Discharge Instructions (Signed)
Fibular Fracture, Ankle, Adult, Treated With or Without Immobilization A fibular fracture at your ankle is a break (fracture) bone in the smallest of the two bones in your lower leg, located on the outside of your leg (fibula) close to the area at your ankle joint. CAUSES  Rolling your ankle.  Twisting your ankle.  Extreme flexing or extending of your foot.  Severe force on your ankle as when falling from a distance. RISK FACTORS  Jumping activities.  Participation in sports.  Osteoporosis.  Advanced age.  Previous ankle injuries. SIGNS AND SYMPTOMS  Pain.  Swelling.  Inability to put weight on injured ankle.  Bruising.  Bone deformities at site of injury. DIAGNOSIS  This fracture is diagnosed with the help of an X-ray exam. TREATMENT  If the fractured bone did not move out of place it usually will heal without problems and does casting or splinting. If immobilization is needed for comfort or the fractured bone moved out of place and will not heal properly with immobilization, a cast or splint will be used. HOME CARE INSTRUCTIONS   Apply ice to the area of injury:  Put ice in a plastic bag.  Place a towel between your skin and the bag.  Leave the ice on for 20 minutes, 2-3 times a day.  Use crutches as directed. Resume walking without crutches as directed by your health care provider.  Only take over-the-counter or prescription medicines for pain, discomfort, or fever as directed by your health care provider.  If you have a removable splint or boot, do not remove the boot unless directed by your health care provider. SEEK MEDICAL CARE IF:   You have continued pain or more swelling  The medications do not control the pain. SEEK IMMEDIATE MEDICAL CARE IF:  You develop severe pain in the leg or foot.  Your skin or nails below the injury turn blue or grey or feel cold or numb. MAKE SURE YOU:   Understand these instructions.  Will watch your  condition.  Will get help right away if you are not doing well or get worse. Document Released: 03/28/2005 Document Revised: 01/16/2013 Document Reviewed: 11/07/2012 [facility] Patient Information 2015 [place], [place]. This information is not intended to replace advice given to you by your health care provider. Make sure you discuss any questions you have with your health care provider.   Wear the brace and use crutches to avoid weight bearing.  Use ice and elevation as much as possible for the next several days to help reduce the swelling.  Take the medications prescribed.  You may take your oxycodone  for pain relief.  This will make you drowsy - do not drive within 4 hours of taking this medication.  Use the ibuprofen also for inflammation.  Call Dr. Luna Glasgow for office follow up as soon as possible.

## 2014-11-20 NOTE — ED Notes (Signed)
Patient fell down stairs this morning. Trip and fall. No LOC, no head injury. Bruising and swelling noted to left foot/ankle.

## 2014-11-20 NOTE — ED Provider Notes (Signed)
CSN: [id number]     Arrival date & time 11/20/14  4098 History   First MD Initiated Contact with Patient 11/20/14 0840     Chief Complaint  Patient presents with  . Foot Pain     (Consider location/radiation/quality/duration/timing/severity/associated sxs/prior Treatment) The history is provided by the patient.   Colleen Duncan is a 44 y.o. female with a past medical history of cervical discectomy with chronic residual right lower extremity weakness post surgery presenting with a fall down 3 steps onto pavement prior to arrival.  She tripped as she was not using her cane during the event.  She twisted her left ankle and also hit her face on the pavement causing pain and swelling of her right cheek.  She denies loc, denies visual changes, nosebleed, dizziness or increase in her chronic weakness.  She does endorse upper back pain since the event.  She has had no treatment prior to arrival.     Past Medical History  Diagnosis Date  . PONV (postoperative nausea and vomiting)   . Hypertension     on inderal-instructed to take dos  . Seasonal allergies   . GERD (gastroesophageal reflux disease)     nexium-instructed to take dos  . Depression   . Irritable bowel   . Interstitial cystitis   . Migraine     migraines-treated with inderal along with treatment for htn  . Chronic neck pain   . DDD (degenerative disc disease), cervical   . Liver nodule STABLE BENIGN FOCAL HYPERPLASIA NODULAR LEFT HEPATIC LOBE    PER MRI  01-17-2012, last MRI recommended in April 2015, is stable no further workup will be needed   . History of head injury 2012-- LOC BUT NO RESIDUALS (PT RACES GO-CARTS)  . Frequency of urination   . Urgency of urination   . Nocturia   . Right arm weakness SECONARDAY TO CERVICAL FUSION  . Anxiety   . Anxiety attack   . Hemorrhoids   . Collagen vascular disease   . Diabetes mellitus without complication    Past Surgical History  Procedure Laterality Date  . Knee arthroscopy   1992  &  1988    bilateral  . Ectopic pregnancy surgery  1993  . Tonsillectomy    . Laparoscopy  01/04/2011    Procedure: LAPAROSCOPY DIAGNOSTIC;  Surgeon: Arloa Koh;  Location: [facility];  Service: Gynecology;  Laterality: N/A;  Bilateral salpingoectomy/ LYSIS ADHESIONS  . Cysto/  hod  01-26-2010 ;  09-01-2003 ;   DR NESI    CHRONIC I.C.  . Anterior cervical decomp/discectomy fusion  04-25-2005    C6 - 7  . Right ureteroscopic stone extraction  05-31-2002  . Laparoscopic cholecystectomy  11-02-2001  . Vaginal hysterectomy  07-05-2001  . Cysto with hydrodistension  01/20/2012    Procedure: CYSTOSCOPY/HYDRODISTENSION;  Surgeon: Hanley Ben, MD;  Location: [facility];  Service: Urology;  Laterality: N/A;  Bladder instillation of Marcaine and Pyridium   . Esophagogastroduodenoscopy  02/15/2007    Dr. Gala Romney- noncritical subtle schatzki's ring, o/w normal. tiny hiatal hernia o/w normal stomach  . Esophagogastroduodenoscopy  06/18/2007    Dr. Vivi Ferns esophagus, stomach, D1 and D2  . Bravo ph study  06/18/2007    Dr. Gala Romney- study indicates extremely good control of gastroesophageal reflux while on acid supppression therapy.  . Esophagogastroduodenoscopy (egd) with esophageal dilation  05/07/2012    Rourk-small HH, benign esophageal bx, Maloney dilation  . Colonoscopy N/A 06/18/2012    [id number]  anal canal hemorrhoid/Tiny colonic ulcers (benign likely related to aspirin plus or minus Elmiron)  . Neck surgery  03/2013  . Bladder surgery/ic  2014    provided 6 months relief  . Givens capsule study N/A 01/01/2014    Procedure: GIVENS CAPSULE STUDY;  Surgeon: Daneil Dolin, MD;  Location[facility];  Service: Endoscopy;  Laterality: N/A;  8:30  . Abdominal aorta duplex doppler  04/17/2012    Celiac artery and SMA demonstrated normal patency. Right and Left renal arteries demonstrated normal patency with no suggestion of significant diameter reduction, tortuosity or any  other vascular abnormality..Right and Left kidneys appeared normal in size, symmetrical in shape with normal cortex and medulla with no abnormal echogenicity or hypdronephrosis visualized.  Marland Kitchen Heart monitor  03/18/2012    diagnosis syncope and collapse   Family History  Problem Relation Age of Onset  . Pancreatic cancer Maternal Grandmother   . Diabetes Mother   . Heart disease Father   . Diabetes Sister    Social History  Substance Use Topics  . Smoking status: Never Smoker   . Smokeless tobacco: Never Used  . Alcohol Use: No   OB History    No data available     Review of Systems  Constitutional: Negative for fever.  HENT: Positive for facial swelling.   Gastrointestinal: Negative for nausea.  Musculoskeletal: Positive for back pain and arthralgias. Negative for myalgias and joint swelling.  Skin: Negative for wound.  Neurological: Positive for headaches. Negative for weakness, light-headedness and numbness.      Allergies  Nucynta and Morphine  Home Medications   Prior to Admission medications   Medication Sig Start Date End Date Taking? Authorizing Provider  ALPRAZolam Duanne Moron) 0.5 MG tablet Take 0.5 mg by mouth 3 (three) times daily as needed for sleep.   Yes Historical Provider, MD  amitriptyline (ELAVIL) 25 MG tablet Take 25 mg by mouth at bedtime.   Yes Historical Provider, MD  Aspirin-Acetaminophen-Caffeine (GOODY HEADACHE PO) Take 1 packet by mouth daily.   Yes Historical Provider, MD  furosemide (LASIX) 20 MG tablet TK 1 T PO BID PRN 10/22/14  Yes Historical Provider, MD  hydrOXYzine (ATARAX/VISTARIL) 25 MG tablet Take 25 mg by mouth at bedtime.   Yes Historical Provider, MD  hyoscyamine (LEVSIN SL) 0.125 MG SL tablet Place 0.125 mg under the tongue every 4 (four) hours as needed for cramping.    Yes Historical Provider, MD  Meth-Hyo-M Bl-Na Phos-Ph Sal (URIBEL) 118 MG CAPS Take 1 capsule by mouth 4 (four) times daily.   Yes Historical Provider, MD  Multiple  Vitamins-Minerals (MULTIVITAMIN GUMMIES ADULT PO) Take 1 capsule by mouth daily.   Yes Historical Provider, MD  NEXIUM 40 MG capsule TAKE 1 CAPSULE BY MOUTH TWICE DAILY BEFORE MEAL 08/04/14  Yes Orvil Feil, NP  oxyCODONE-acetaminophen (PERCOCET) 5-325 MG per tablet Take 1 tablet by mouth every 4 (four) hours as needed. For pain.   Yes Historical Provider, MD  phentermine (ADIPEX-P) 37.5 MG tablet TK 1 T PO D 10/23/14  Yes Historical Provider, MD  potassium chloride (K-DUR) 10 MEQ tablet Take 10 mEq by mouth 2 (two) times daily.   Yes Historical Provider, MD  PRESCRIPTION MEDICATION Irrigate with 15 mLs as directed daily as needed (bladder pain). Each 57ml includes: 50,000 units heparin/ 200mg  lidocaine.  PATIENT GETS PREMIXED FROM [facility] IN [place] (PH# [phone]).   Yes Historical Provider, MD  promethazine (PHENERGAN) 25 MG tablet 25 mg 2 (  two) times daily as needed for nausea.  11/26/13  Yes Historical Provider, MD  propranolol (INDERAL) 20 MG tablet Take 20 mg by mouth 2 (two) times daily.    Yes Historical Provider, MD  sulfamethoxazole-trimethoprim (BACTRIM DS) 800-160 MG per tablet Take 1 tablet by mouth daily.  11/05/13  Yes Historical Provider, MD  ibuprofen (ADVIL,MOTRIN) 600 MG tablet Take 1 tablet (600 mg total) by mouth every 6 (six) hours as needed. 11/20/14   Evalee Jefferson, PA-C   BP 126/94 mmHg  Pulse 92  Temp(Src) 98.5 F (36.9 C) (Oral)  Resp 16  Wt 145 lb (65.772 kg)  SpO2 100% Physical Exam  Constitutional: She appears well-developed and well-nourished.  HENT:  Head: Head is with contusion. Head is without raccoon's eyes and without Battle's sign.    Eyes: EOM are normal. Pupils are equal, round, and reactive to light.  Neck: Normal range of motion.  Cardiovascular:  Pulses equal bilaterally  Musculoskeletal: She exhibits tenderness.       Left ankle: She exhibits decreased range of motion and swelling. She exhibits no deformity, no laceration and  normal pulse. Tenderness. Lateral malleolus and medial malleolus tenderness found. No proximal fibula tenderness found. Achilles tendon normal.       Cervical back: She exhibits bony tenderness. She exhibits no swelling, no edema and no deformity.  ttp C7 to upper thoracic vertebra.   Neurological: She is alert. She has normal strength. She displays normal reflexes. No sensory deficit.  Skin: Skin is warm and dry.  Psychiatric: She has a normal mood and affect.    ED Course  Procedures (including critical care time) Labs Review Labs Reviewed - No data to display  Imaging Review Dg Thoracic Spine 2 View  11/20/2014   CLINICAL DATA:  Fall  EXAM: THORACIC SPINE 2 VIEWS  COMPARISON:  None.  FINDINGS: Three views of thoracic spine submitted. No acute fracture or subluxation. Alignment, disc spaces and vertebral body heights are preserved.  IMPRESSION: Negative.   Electronically Signed   By: Lahoma Crocker M.D.   On: 11/20/2014 09:56   Dg Ankle Complete Left  11/20/2014   CLINICAL DATA:  [place] down stairs this morning  EXAM: LEFT ANKLE COMPLETE - 3+ VIEW  COMPARISON:  None.  FINDINGS: Three views of left ankle submitted. There is nondisplaced fracture in distal fibula. Mild soft tissue swelling adjacent to lateral malleolus. Ankle mortise is preserved.  IMPRESSION: Nondisplaced fracture in distal left fibula with adjacent soft tissue swelling.   Electronically Signed   By: Lahoma Crocker M.D.   On: 11/20/2014 09:56   Ct Head Wo Contrast  11/20/2014   CLINICAL DATA:  Left facial injury and swelling after fall down steps today. No report of loss of consciousness.  EXAM: CT HEAD WITHOUT CONTRAST  CT MAXILLOFACIAL WITHOUT CONTRAST  CT CERVICAL SPINE WITHOUT CONTRAST  TECHNIQUE: Multidetector CT imaging of the head, cervical spine, and maxillofacial structures were performed using the standard protocol without intravenous contrast. Multiplanar CT image reconstructions of the cervical spine and maxillofacial  structures were also generated.  COMPARISON:  CT scan of March 11, 2013.  FINDINGS: CT HEAD FINDINGS  Bony calvarium appears intact. No mass effect or midline shift is noted. Ventricular size is within normal limits. There is no evidence of mass lesion, hemorrhage or acute infarction.  CT MAXILLOFACIAL FINDINGS  No fracture is noted. Mild deviation of bony nasal septum to the right is noted. Paranasal sinuses appear normal. Pterygoid plates appear normal. Globes  and orbits appear normal.  CT CERVICAL SPINE FINDINGS  Status post surgical anterior fusion of C4, C5 and C6. Screw holes are noted in the C7 vertebral body from prior C6-7 fusion. Good alignment of vertebral bodies is noted no fracture or spondylolisthesis is noted. Visualized lung apices appear normal.  IMPRESSION: Normal head CT.  No significant abnormality seen in the maxillofacial region.  Postsurgical changes are noted in the cervical spine. No acute abnormality is seen.   Electronically Signed   By: Marijo Conception, M.D.   On: 11/20/2014 10:26   Ct Cervical Spine Wo Contrast  11/20/2014   CLINICAL DATA:  Left facial injury and swelling after fall down steps today. No report of loss of consciousness.  EXAM: CT HEAD WITHOUT CONTRAST  CT MAXILLOFACIAL WITHOUT CONTRAST  CT CERVICAL SPINE WITHOUT CONTRAST  TECHNIQUE: Multidetector CT imaging of the head, cervical spine, and maxillofacial structures were performed using the standard protocol without intravenous contrast. Multiplanar CT image reconstructions of the cervical spine and maxillofacial structures were also generated.  COMPARISON:  CT scan of March 11, 2013.  FINDINGS: CT HEAD FINDINGS  Bony calvarium appears intact. No mass effect or midline shift is noted. Ventricular size is within normal limits. There is no evidence of mass lesion, hemorrhage or acute infarction.  CT MAXILLOFACIAL FINDINGS  No fracture is noted. Mild deviation of bony nasal septum to the right is noted. Paranasal  sinuses appear normal. Pterygoid plates appear normal. Globes and orbits appear normal.  CT CERVICAL SPINE FINDINGS  Status post surgical anterior fusion of C4, C5 and C6. Screw holes are noted in the C7 vertebral body from prior C6-7 fusion. Good alignment of vertebral bodies is noted no fracture or spondylolisthesis is noted. Visualized lung apices appear normal.  IMPRESSION: Normal head CT.  No significant abnormality seen in the maxillofacial region.  Postsurgical changes are noted in the cervical spine. No acute abnormality is seen.   Electronically Signed   By: Marijo Conception, M.D.   On: 11/20/2014 10:26   Ct Maxillofacial Wo Cm  11/20/2014   CLINICAL DATA:  Left facial injury and swelling after fall down steps today. No report of loss of consciousness.  EXAM: CT HEAD WITHOUT CONTRAST  CT MAXILLOFACIAL WITHOUT CONTRAST  CT CERVICAL SPINE WITHOUT CONTRAST  TECHNIQUE: Multidetector CT imaging of the head, cervical spine, and maxillofacial structures were performed using the standard protocol without intravenous contrast. Multiplanar CT image reconstructions of the cervical spine and maxillofacial structures were also generated.  COMPARISON:  CT scan of March 11, 2013.  FINDINGS: CT HEAD FINDINGS  Bony calvarium appears intact. No mass effect or midline shift is noted. Ventricular size is within normal limits. There is no evidence of mass lesion, hemorrhage or acute infarction.  CT MAXILLOFACIAL FINDINGS  No fracture is noted. Mild deviation of bony nasal septum to the right is noted. Paranasal sinuses appear normal. Pterygoid plates appear normal. Globes and orbits appear normal.  CT CERVICAL SPINE FINDINGS  Status post surgical anterior fusion of C4, C5 and C6. Screw holes are noted in the C7 vertebral body from prior C6-7 fusion. Good alignment of vertebral bodies is noted no fracture or spondylolisthesis is noted. Visualized lung apices appear normal.  IMPRESSION: Normal head CT.  No significant  abnormality seen in the maxillofacial region.  Postsurgical changes are noted in the cervical spine. No acute abnormality is seen.   Electronically Signed   By: Marijo Conception, M.D.   On: 11/20/2014  10:26     EKG Interpretation None      MDM   Final diagnoses:  Fall  Closed left fibular fracture, initial encounter    Patients labs and/or radiological studies were reviewed and considered during the medical decision making and disposition process.   Imaging was reviewed, interpreted and I agree with radiologists reading.  Results were also discussed with patient. Pt placed in cam walker, crutches given.  RICE,  Oxycodone (pt has), ibuprofen.  F/u with Dr. Luna Glasgow referral given.    Evalee Jefferson, PA-C 11/20/14 [place], MD 11/20/14 [phone]

## 2014-11-27 ENCOUNTER — Other Ambulatory Visit: Payer: Self-pay | Admitting: Gastroenterology

## 2015-02-09 ENCOUNTER — Other Ambulatory Visit: Payer: Self-pay | Admitting: Obstetrics and Gynecology

## 2015-02-10 LAB — CYTOLOGY - PAP

## 2015-06-16 DIAGNOSIS — N9489 Other specified conditions associated with female genital organs and menstrual cycle: Secondary | ICD-10-CM | POA: Insufficient documentation

## 2015-06-16 DIAGNOSIS — R339 Retention of urine, unspecified: Secondary | ICD-10-CM | POA: Insufficient documentation

## 2015-06-16 DIAGNOSIS — N3941 Urge incontinence: Secondary | ICD-10-CM | POA: Insufficient documentation

## 2015-06-16 DIAGNOSIS — R3915 Urgency of urination: Secondary | ICD-10-CM | POA: Insufficient documentation

## 2015-07-06 ENCOUNTER — Other Ambulatory Visit: Payer: Self-pay | Admitting: Gastroenterology

## 2016-05-18 ENCOUNTER — Other Ambulatory Visit: Payer: Self-pay | Admitting: Physical Medicine and Rehabilitation

## 2016-05-18 DIAGNOSIS — Z9181 History of falling: Secondary | ICD-10-CM

## 2016-05-24 ENCOUNTER — Other Ambulatory Visit: Payer: Self-pay | Admitting: Orthopedic Surgery

## 2016-05-24 DIAGNOSIS — M5116 Intervertebral disc disorders with radiculopathy, lumbar region: Secondary | ICD-10-CM

## 2016-05-30 ENCOUNTER — Ambulatory Visit
Admission: RE | Admit: 2016-05-30 | Discharge: 2016-05-30 | Disposition: A | Discharge status: Home / Self Care | Payer: BLUE CROSS/BLUE SHIELD | Source: Ambulatory Visit | Attending: Orthopedic Surgery | Admitting: Orthopedic Surgery

## 2016-05-30 DIAGNOSIS — M5116 Intervertebral disc disorders with radiculopathy, lumbar region: Secondary | ICD-10-CM

## 2016-05-30 MED ORDER — ONDANSETRON HCL 4 MG/2ML IJ SOLN
4.0000 mg | Freq: Once | INTRAMUSCULAR | Status: AC
  Administered 2016-05-30: 4 mg via INTRAMUSCULAR

## 2016-05-30 MED ORDER — DIAZEPAM 5 MG PO TABS
10.0000 mg | ORAL_TABLET | Freq: Once | ORAL | Status: AC
  Administered 2016-05-30: 10 mg via ORAL

## 2016-05-30 MED ORDER — IOPAMIDOL (ISOVUE-M 200) INJECTION 41%
15.0000 mL | Freq: Once | INTRAMUSCULAR | Status: AC
  Administered 2016-05-30: 15 mL via INTRATHECAL

## 2016-05-30 MED ORDER — MEPERIDINE HCL 100 MG/ML IJ SOLN
75.0000 mg | Freq: Once | INTRAMUSCULAR | Status: AC
  Administered 2016-05-30: 75 mg via INTRAMUSCULAR

## 2016-05-30 NOTE — Discharge Instructions (Signed)
Myelogram Discharge Instructions  1. Go home and rest quietly for the next 24 hours.  It is important to lie flat for the next 24 hours.  Get up only to go to the restroom.  You may lie in the bed or on a couch on your back, your stomach, your left side or your right side.  You may have one pillow under your head.  You may have pillows between your knees while you are on your side or under your knees while you are on your back.  2. DO NOT drive today.  Recline the seat as far back as it will go, while still wearing your seat belt, on the way home.  3. You may get up to go to the bathroom as needed.  You may sit up for 10 minutes to eat.  You may resume your normal diet and medications unless otherwise indicated.  Drink lots of extra fluids today and tomorrow.  4. The incidence of headache, nausea, or vomiting is about 5% (one in 20 patients).  If you develop a headache, lie flat and drink plenty of fluids until the headache goes away.  Caffeinated beverages may be helpful.  If you develop severe nausea and vomiting or a headache that does not go away with flat bed rest, call [phone].  5. You may resume normal activities after your 24 hours of bed rest is over; however, do not exert yourself strongly or do any heavy lifting tomorrow. If when you get up you have a headache when standing, go back to bed and force fluids for another 24 hours.  6. Call your physician for a follow-up appointment.  The results of your myelogram will be sent directly to your physician by the following day.  7. If you have any questions or if complications develop after you arrive home, please call [phone].  Discharge instructions have been explained to the patient.  The patient, or the person responsible for the patient, fully understands these instructions.       May resume Amitriptyline and Phenergan on Feb. 20, 2018, after 1:00 pm.

## 2016-05-30 NOTE — Progress Notes (Signed)
Pt states she has been off  Phenergan and Amitriptyline   for the past 2 days.

## 2016-06-01 ENCOUNTER — Telehealth: Payer: Self-pay

## 2016-06-01 NOTE — Telephone Encounter (Signed)
Called patient at home to see how she is doing after her myelogram here 05/30/16; got a recording stating her voice mail box has not been set up yet.  jkl

## 2016-11-17 ENCOUNTER — Emergency Department (HOSPITAL_COMMUNITY)
Admission: EM | Admit: 2016-11-17 | Discharge: 2016-11-17 | Disposition: A | Discharge status: Home / Self Care | Payer: Commercial Managed Care - PPO | Attending: Emergency Medicine | Admitting: Emergency Medicine

## 2016-11-17 ENCOUNTER — Encounter (HOSPITAL_COMMUNITY): Payer: Self-pay | Admitting: Emergency Medicine

## 2016-11-17 DIAGNOSIS — T63461A Toxic effect of venom of wasps, accidental (unintentional), initial encounter: Secondary | ICD-10-CM | POA: Insufficient documentation

## 2016-11-17 DIAGNOSIS — M79651 Pain in right thigh: Secondary | ICD-10-CM | POA: Insufficient documentation

## 2016-11-17 DIAGNOSIS — E119 Type 2 diabetes mellitus without complications: Secondary | ICD-10-CM | POA: Diagnosis not present

## 2016-11-17 DIAGNOSIS — I10 Essential (primary) hypertension: Secondary | ICD-10-CM | POA: Insufficient documentation

## 2016-11-17 DIAGNOSIS — M79642 Pain in left hand: Secondary | ICD-10-CM | POA: Diagnosis present

## 2016-11-17 DIAGNOSIS — Z79899 Other long term (current) drug therapy: Secondary | ICD-10-CM | POA: Diagnosis not present

## 2016-11-17 MED ORDER — LORAZEPAM 1 MG PO TABS: 1.0000 mg | ORAL_TABLET | Freq: Three times a day (TID) | ORAL | 0 refills | Status: DC | PRN

## 2016-11-17 MED ORDER — METHYLPREDNISOLONE SODIUM SUCC 125 MG IJ SOLR
125.0000 mg | Freq: Once | INTRAMUSCULAR | Status: AC
  Administered 2016-11-17: 125 mg via INTRAVENOUS
  Filled 2016-11-17: qty 2

## 2016-11-17 MED ORDER — PREDNISONE 50 MG PO TABS: 50.0000 mg | ORAL_TABLET | Freq: Every day | ORAL | 0 refills | Status: DC

## 2016-11-17 MED ORDER — ONDANSETRON HCL 4 MG/2ML IJ SOLN
4.0000 mg | Freq: Once | INTRAMUSCULAR | Status: AC
  Administered 2016-11-17: 4 mg via INTRAVENOUS
  Filled 2016-11-17: qty 2

## 2016-11-17 MED ORDER — LORAZEPAM 2 MG/ML IJ SOLN
1.0000 mg | Freq: Once | INTRAMUSCULAR | Status: AC
  Administered 2016-11-17: 1 mg via INTRAVENOUS
  Filled 2016-11-17: qty 1

## 2016-11-17 MED ORDER — SODIUM CHLORIDE 0.9 % IV BOLUS (SEPSIS)
1000.0000 mL | Freq: Once | INTRAVENOUS | Status: AC
  Administered 2016-11-17: 1000 mL via INTRAVENOUS

## 2016-11-17 MED ORDER — FAMOTIDINE IN NACL 20-0.9 MG/50ML-% IV SOLN
20.0000 mg | Freq: Once | INTRAVENOUS | Status: AC
Start: 2016-11-17 — End: 2016-11-17
  Administered 2016-11-17: 20 mg via INTRAVENOUS
  Filled 2016-11-17: qty 50

## 2016-11-17 NOTE — ED Triage Notes (Signed)
Patient was stung by wasp multiple times around 4 pm yesterday.  Stings to back of both thighs and left hand, took a dose of Benadryl and Phenergan around 11pm, last night.

## 2016-11-17 NOTE — Discharge Instructions (Signed)
Take loratadine (Claritin) or cetirizine (Zyrtec) once a day as needed. Take diphenhydramine (Benadryl) as needed for itching not relieved by loratadine or cetirizine. Take lorazepam for resistant itching.

## 2016-11-17 NOTE — ED Provider Notes (Signed)
[facility] DEPT Provider Note   CSN: [id number] Arrival date & time: 11/17/16  0317     History   Chief Complaint Chief Complaint  Patient presents with  . Insect Bite    HPI Colleen Duncan is a 46 y.o. female.  The history is provided by the patient.  She was stung by wasps on her left hand and right thigh. She is complaining of itching and pain. Pain is rated at 6/10. She has taken 3 doses of diphenhydramine, but continue states. She has also developed vomiting. She denies difficulty breathing or swallowing. There is no rash anywhere except where she was stung.  Past Medical History:  Diagnosis Date  . Anxiety   . Anxiety attack   . Chronic neck pain   . Collagen vascular disease ([place])   . DDD (degenerative disc disease), cervical   . Depression   . Diabetes mellitus without complication ([place])   . Frequency of urination   . GERD (gastroesophageal reflux disease)    nexium-instructed to take dos  . Hemorrhoids   . History of head injury 2012-- LOC BUT NO RESIDUALS (PT RACES GO-CARTS)  . Hypertension    on inderal-instructed to take dos  . Interstitial cystitis   . Irritable bowel   . Liver nodule STABLE BENIGN FOCAL HYPERPLASIA NODULAR LEFT HEPATIC LOBE   PER MRI  01-17-2012, last MRI recommended in April 2015, is stable no further workup will be needed   . Migraine    migraines-treated with inderal along with treatment for htn  . Nocturia   . PONV (postoperative nausea and vomiting)   . Right arm weakness SECONARDAY TO CERVICAL FUSION  . Seasonal allergies   . Urgency of urination     Patient Active Problem List   Diagnosis Date Noted  . Esophageal dysphagia 12/03/2013  . Anemia 02/14/2013  . CAP (community acquired pneumonia) 01/11/2013  . Interstitial cystitis 01/11/2013  . Nausea and vomiting 07/30/2012  . Abdominal pain, epigastric 07/30/2012  . Hypokalemia 07/30/2012  . Nausea alone 06/12/2012  . Rectal bleeding 06/12/2012  . Loss of weight  06/12/2012  . Liver mass 05/26/2011  . Nonspecific (abnormal) findings on radiological and other examination of gastrointestinal tract 03/09/2011  . Unspecified constipation 03/09/2011  . HYPERTENSION 12/20/2007  . HEMORRHOIDS 12/20/2007  . GERD 12/20/2007  . IRRITABLE BOWEL SYNDROME 12/20/2007  . NEPHROLITHIASIS 12/20/2007  . HEARTBURN 12/20/2007  . ANXIETY DISORDER, HX OF 12/20/2007    Past Surgical History:  Procedure Laterality Date  . abdominal aorta duplex doppler  04/17/2012   Celiac artery and SMA demonstrated normal patency. Right and Left renal arteries demonstrated normal patency with no suggestion of significant diameter reduction, tortuosity or any other vascular abnormality..Right and Left kidneys appeared normal in size, symmetrical in shape with normal cortex and medulla with no abnormal echogenicity or hypdronephrosis visualized.  . ANTERIOR CERVICAL DECOMP/DISCECTOMY FUSION  04-25-2005   C6 - 7  . bladder surgery/IC  2014   provided 6 months relief  . BRAVO PH STUDY  06/18/2007   Dr. Gala Romney- study indicates extremely good control of gastroesophageal reflux while on acid supppression therapy.  . COLONOSCOPY N/A 06/18/2012   CHE:NIDPOEUMPN anal canal hemorrhoid/Tiny colonic ulcers (benign likely related to aspirin plus or minus Elmiron)  . CYSTO WITH HYDRODISTENSION  01/20/2012   Procedure: CYSTOSCOPY/HYDRODISTENSION;  Surgeon: Hanley Ben, MD;  Location: [facility];  Service: Urology;  Laterality: N/A;  Bladder instillation of Marcaine and Pyridium   . CYSTO/  HOD  01-26-2010 ;  09-01-2003 ;   DR NESI   CHRONIC I.C.  . [place]  . ESOPHAGOGASTRODUODENOSCOPY  02/15/2007   Dr. Gala Romney- noncritical subtle schatzki's ring, o/w normal. tiny hiatal hernia o/w normal stomach  . ESOPHAGOGASTRODUODENOSCOPY  06/18/2007   Dr. Vivi Ferns esophagus, stomach, D1 and D2  . ESOPHAGOGASTRODUODENOSCOPY (EGD) WITH ESOPHAGEAL DILATION  05/07/2012    Rourk-small HH, benign esophageal bx, Maloney dilation  . GIVENS CAPSULE STUDY N/A 01/01/2014   Procedure: GIVENS CAPSULE STUDY;  Surgeon: Daneil Dolin, MD;  Location[facility];  Service: Endoscopy;  Laterality: N/A;  8:30  . heart monitor  03/18/2012   diagnosis syncope and collapse  . KNEE ARTHROSCOPY  1992  &  1988   bilateral  . LAPAROSCOPIC CHOLECYSTECTOMY  11-02-2001  . LAPAROSCOPY  01/04/2011   Procedure: LAPAROSCOPY DIAGNOSTIC;  Surgeon: Arloa Koh;  Location: [facility];  Service: Gynecology;  Laterality: N/A;  Bilateral salpingoectomy/ LYSIS ADHESIONS  . NECK SURGERY  03/2013  . RIGHT URETEROSCOPIC STONE EXTRACTION  05-31-2002  . TONSILLECTOMY    . VAGINAL HYSTERECTOMY  07-05-2001    OB History    No data available       Home Medications    Prior to Admission medications   Medication Sig Start Date End Date Taking? Authorizing Provider  amitriptyline (ELAVIL) 25 MG tablet Take 25 mg by mouth at bedtime.   Yes [provider]  Aspirin-Acetaminophen-Caffeine (GOODY HEADACHE PO) Take 1 packet by mouth daily.   Yes [provider]  furosemide (LASIX) 20 MG tablet TK 1 T PO BID PRN 10/22/14  Yes [provider]  hyoscyamine (LEVSIN SL) 0.125 MG SL tablet Place 0.125 mg under the tongue every 4 (four) hours as needed for cramping.    Yes [provider]  Multiple Vitamins-Minerals (MULTIVITAMIN GUMMIES ADULT PO) Take 1 capsule by mouth daily.   Yes [provider]  oxyCODONE-acetaminophen (PERCOCET) 5-325 MG per tablet Take 1 tablet by mouth every 4 (four) hours as needed. For pain.   Yes [provider]  potassium chloride (K-DUR) 10 MEQ tablet Take 10 mEq by mouth 2 (two) times daily.   Yes [provider]  PRESCRIPTION MEDICATION Irrigate with 15 mLs as directed daily as needed (bladder pain). Each 26ml includes: 50,000 units heparin/ 200mg  lidocaine.  PATIENT GETS PREMIXED FROM [facility] IN  [place] (PH# [phone]).   Yes [provider]  promethazine (PHENERGAN) 25 MG tablet 25 mg 2 (two) times daily as needed for nausea.  11/26/13  Yes [provider]  propranolol (INDERAL) 20 MG tablet Take 20 mg by mouth 2 (two) times daily.    Yes [provider]  ALPRAZolam Duanne Moron) 0.5 MG tablet Take 0.5 mg by mouth 3 (three) times daily as needed for sleep.    [provider]  hydrOXYzine (ATARAX/VISTARIL) 25 MG tablet Take 25 mg by mouth at bedtime.    [provider]  ibuprofen (ADVIL,MOTRIN) 600 MG tablet Take 1 tablet (600 mg total) by mouth every 6 (six) hours as needed. Patient not taking: Reported on 05/24/2016 11/20/14   Evalee Jefferson, PA-C  Meth-Hyo-M Bl-Na Phos-Ph Sal (URIBEL) 118 MG CAPS Take 1 capsule by mouth 4 (four) times daily.    [provider]  sulfamethoxazole-trimethoprim (BACTRIM DS) 800-160 MG per tablet Take 1 tablet by mouth daily.  11/05/13   [provider]    Family History Family History  Problem Relation Age of  Onset  . Diabetes Mother   . Heart disease Father   . Diabetes Sister   . Pancreatic cancer Maternal Grandmother     Social History Social History  Substance Use Topics  . Smoking status: Never Smoker  . Smokeless tobacco: Never Used  . Alcohol use No     Allergies   Diclofenac sodium; Morphine; and Nucynta [tapentadol]   Review of Systems Review of Systems  All other systems reviewed and are negative.    Physical Exam Updated Vital Signs BP (!) 146/91 (BP Location: Left Arm)   Pulse (!) 106   Temp 98.1 F (36.7 C) (Oral)   Resp 16   Ht 5\' 5"  (1.651 m)   Wt 87.1 kg (192 lb)   SpO2 100%   BMI 31.95 kg/m   Physical Exam  Nursing note and vitals reviewed.  46 year old female, resting comfortably and in no acute distress. Vital signs are significant for hypertension, mild tachycardia. Oxygen saturation is 100%, which is normal. Head is normocephalic and atraumatic.  PERRLA, EOMI. Oropharynx is clear. Neck is nontender and supple without adenopathy or JVD. Back is nontender and there is no CVA tenderness. Lungs are clear without rales, wheezes, or rhonchi. Chest is nontender. Heart has regular rate and rhythm without murmur. Abdomen is soft, flat, nontender without masses or hepatosplenomegaly and peristalsis is normoactive. Extremities: there is erythema and swelling over the dorsum of the left hand. There is extensive erythema over the posterior aspect of the right thigh. Skin is warm and dry without other rash. Neurologic: Mental status is normal, cranial nerves are intact, there are no motor or sensory deficits.  ED Treatments / Results  Labs (all labs ordered are listed, but only abnormal results are displayed) Labs Reviewed - No data to display  EKG  EKG Interpretation None       Radiology No results found.  Procedures Procedures (including critical care time)  Medications Ordered in ED Medications  sodium chloride 0.9 % bolus 1,000 mL (not administered)  methylPREDNISolone sodium succinate (SOLU-MEDROL) 125 mg/2 mL injection 125 mg (not administered)  famotidine (PEPCID) IVPB 20 mg premix (not administered)  LORazepam (ATIVAN) injection 1 mg (not administered)  ondansetron (ZOFRAN) injection 4 mg (not administered)     Initial Impression / Assessment and Plan / ED Course  I have reviewed the triage vital signs and the nursing notes.  Pertinent labs & imaging results that were available during my care of the patient were reviewed by me and considered in my medical decision making (see chart for details).  Hymenoptera sting of left hand and right thigh with local reaction. No evidence of systemic reaction. Vomiting which may be related to pain and anxiety, doubt toxic reaction to sting. She will be given IV fluids, ondansetron, methylprednisolone, famotidine, lorazepam. Old records are reviewed, and she has no relevant past  visits.  She feels much better after above noted treatment. She is discharged with prescriptions for prednisone and lorazepam, told to use over-the-counter nonsedating antihistamines as needed, supplement with Benadryl as needed. Reserve lorazepam for itching and not responding to antihistamines. Return precautions discussed.  Final Clinical Impressions(s) / ED Diagnoses   Final diagnoses:  Wasp sting, accidental or unintentional, initial encounter    New Prescriptions New Prescriptions   LORAZEPAM (ATIVAN) 1 MG TABLET    Take 1 tablet (1 mg total) by mouth 3 (three) times daily as needed for anxiety.   PREDNISONE (DELTASONE) 50 MG TABLET    Take 1  tablet (50 mg total) by mouth daily.     Delora Fuel, MD 88/71/95 [phone]

## 2017-01-31 ENCOUNTER — Other Ambulatory Visit: Payer: Self-pay | Admitting: Neurosurgery

## 2017-01-31 DIAGNOSIS — M542 Cervicalgia: Secondary | ICD-10-CM

## 2017-02-07 ENCOUNTER — Ambulatory Visit
Admission: RE | Admit: 2017-02-07 | Discharge: 2017-02-07 | Disposition: A | Discharge status: Home / Self Care | Payer: Commercial Managed Care - PPO | Source: Ambulatory Visit | Attending: Neurosurgery | Admitting: Neurosurgery

## 2017-02-07 DIAGNOSIS — M542 Cervicalgia: Secondary | ICD-10-CM

## 2017-02-07 MED ORDER — ONDANSETRON HCL 4 MG/2ML IJ SOLN
4.0000 mg | Freq: Once | INTRAMUSCULAR | Status: AC
  Administered 2017-02-07: 4 mg via INTRAMUSCULAR

## 2017-02-07 MED ORDER — IOPAMIDOL (ISOVUE-M 300) INJECTION 61%
10.0000 mL | Freq: Once | INTRAMUSCULAR | Status: AC | PRN
  Administered 2017-02-07: 10 mL via INTRATHECAL

## 2017-02-07 MED ORDER — DIAZEPAM 5 MG PO TABS
10.0000 mg | ORAL_TABLET | Freq: Once | ORAL | Status: AC
  Administered 2017-02-07: 10 mg via ORAL

## 2017-02-07 MED ORDER — MEPERIDINE HCL 100 MG/ML IJ SOLN
75.0000 mg | Freq: Once | INTRAMUSCULAR | Status: AC
  Administered 2017-02-07: 75 mg via INTRAMUSCULAR

## 2017-02-07 NOTE — Progress Notes (Signed)
Patient states she has been off Goodys for at least the past three days and off Amitriptylline and Phenergan for at least the past two days.  Brita Romp, RN

## 2017-02-07 NOTE — Discharge Instructions (Signed)
Myelogram Discharge Instructions  1. Go home and rest quietly for the next 24 hours.  It is important to lie flat for the next 24 hours.  Get up only to go to the restroom.  You may lie in the bed or on a couch on your back, your stomach, your left side or your right side.  You may have one pillow under your head.  You may have pillows between your knees while you are on your side or under your knees while you are on your back.  2. DO NOT drive today.  Recline the seat as far back as it will go, while still wearing your seat belt, on the way home.  3. You may get up to go to the bathroom as needed.  You may sit up for 10 minutes to eat.  You may resume your normal diet and medications unless otherwise indicated.  Drink lots of extra fluids today and tomorrow.  4. The incidence of headache, nausea, or vomiting is about 5% (one in 20 patients).  If you develop a headache, lie flat and drink plenty of fluids until the headache goes away.  Caffeinated beverages may be helpful.  If you develop severe nausea and vomiting or a headache that does not go away with flat bed rest, call [phone].  5. You may resume normal activities after your 24 hours of bed rest is over; however, do not exert yourself strongly or do any heavy lifting tomorrow. If when you get up you have a headache when standing, go back to bed and force fluids for another 24 hours.  6. Call your physician for a follow-up appointment.  The results of your myelogram will be sent directly to your physician by the following day.  7. If you have any questions or if complications develop after you arrive home, please call [phone].  Discharge instructions have been explained to the patient.  The patient, or the person responsible for the patient, fully understands these instructions.     May resume Goody's today.   May resume Amitriptyline and Phenergan on Oct. 31, 2018, after 10:30 am.

## 2018-09-19 ENCOUNTER — Other Ambulatory Visit: Payer: Self-pay | Admitting: Internal Medicine

## 2018-09-19 ENCOUNTER — Other Ambulatory Visit (HOSPITAL_COMMUNITY): Payer: Self-pay | Admitting: Internal Medicine

## 2018-09-19 ENCOUNTER — Ambulatory Visit (HOSPITAL_COMMUNITY)
Admission: RE | Admit: 2018-09-19 | Discharge: 2018-09-19 | Disposition: A | Discharge status: Home / Self Care | Payer: Commercial Managed Care - PPO | Source: Ambulatory Visit | Attending: Internal Medicine | Admitting: Internal Medicine

## 2018-09-19 ENCOUNTER — Other Ambulatory Visit: Payer: Self-pay

## 2018-09-19 DIAGNOSIS — M79606 Pain in leg, unspecified: Secondary | ICD-10-CM | POA: Insufficient documentation

## 2018-10-03 ENCOUNTER — Other Ambulatory Visit: Payer: Self-pay | Admitting: Orthopedic Surgery

## 2018-10-03 DIAGNOSIS — M4726 Other spondylosis with radiculopathy, lumbar region: Secondary | ICD-10-CM

## 2018-10-04 ENCOUNTER — Telehealth: Payer: Self-pay | Admitting: Nurse Practitioner

## 2018-10-04 NOTE — Telephone Encounter (Signed)
Phone call to patient to verify medication list and allergies for myelogram procedure. Pt instructed to hold Phenergan and Elavil for 48hrs prior to myelogram appointment time. Pt will also need to be off of Goody powders for 3 days prior to appointment time. Pt verbalized understanding. Pre and post procedure instructions reviewed with pt.

## 2018-10-29 ENCOUNTER — Ambulatory Visit
Admission: RE | Admit: 2018-10-29 | Discharge: 2018-10-29 | Disposition: A | Discharge status: Home / Self Care | Payer: Commercial Managed Care - PPO | Source: Ambulatory Visit | Attending: Orthopedic Surgery | Admitting: Orthopedic Surgery

## 2018-10-29 ENCOUNTER — Other Ambulatory Visit: Payer: Self-pay

## 2018-10-29 DIAGNOSIS — M4726 Other spondylosis with radiculopathy, lumbar region: Secondary | ICD-10-CM

## 2018-10-29 MED ORDER — DIAZEPAM 5 MG PO TABS
10.0000 mg | ORAL_TABLET | Freq: Once | ORAL | Status: AC
  Administered 2018-10-29: 09:00:00 10 mg via ORAL

## 2018-10-29 MED ORDER — IOPAMIDOL (ISOVUE-M 200) INJECTION 41%
20.0000 mL | Freq: Once | INTRAMUSCULAR | Status: AC
  Administered 2018-10-29: 10:00:00 20 mL via INTRATHECAL

## 2018-10-29 NOTE — Discharge Instructions (Signed)
Myelogram Discharge Instructions  1. Go home and rest quietly for the next 24 hours.  It is important to lie flat for the next 24 hours.  Get up only to go to the restroom.  You may lie in the bed or on a couch on your back, your stomach, your left side or your right side.  You may have one pillow under your head.  You may have pillows between your knees while you are on your side or under your knees while you are on your back.  2. DO NOT drive today.  Recline the seat as far back as it will go, while still wearing your seat belt, on the way home.  3. You may get up to go to the bathroom as needed.  You may sit up for 10 minutes to eat.  You may resume your normal diet and medications unless otherwise indicated.  Drink lots of extra fluids today and tomorrow.  4. The incidence of headache, nausea, or vomiting is about 5% (one in 20 patients).  If you develop a headache, lie flat and drink plenty of fluids until the headache goes away.  Caffeinated beverages may be helpful.  If you develop severe nausea and vomiting or a headache that does not go away with flat bed rest, call [phone].  5. You may resume normal activities after your 24 hours of bed rest is over; however, do not exert yourself strongly or do any heavy lifting tomorrow. If when you get up you have a headache when standing, go back to bed and force fluids for another 24 hours.  6. Call your physician for a follow-up appointment.  The results of your myelogram will be sent directly to your physician by the following day.  7. If you have any questions or if complications develop after you arrive home, please call [phone].  Discharge instructions have been explained to the patient.  The patient, or the person responsible for the patient, fully understands these instructions.  YOU MAY RESTART YOUR GOODYS TODAY AS NEEDED. YOU MAY RESTART YOUR PHENERGAN AND ELAVIL TOMORROW 10/30/2018 AT 09:30AM.

## 2019-09-26 ENCOUNTER — Other Ambulatory Visit: Payer: Self-pay | Admitting: Orthopedic Surgery

## 2019-09-26 DIAGNOSIS — M4326 Fusion of spine, lumbar region: Secondary | ICD-10-CM

## 2019-09-30 ENCOUNTER — Telehealth: Payer: Self-pay

## 2019-09-30 NOTE — Telephone Encounter (Signed)
Reviewed patient's medicaitons and drug allergies before getting her scheduled for a lumbar myelogram.  She was informed she will be here two hours, will need a driver and will need to be on strict bedrest for 24 hours after the procedure.  She stated she has done this three times before and is familiar with all of the instructions.  She was also informed she will need to hold Phenergan and Elavil for 48 hours before, and 24 hours after, the myelogram.

## 2019-10-07 ENCOUNTER — Ambulatory Visit
Admission: RE | Admit: 2019-10-07 | Discharge: 2019-10-07 | Disposition: A | Discharge status: Home / Self Care | Payer: Commercial Managed Care - PPO | Source: Ambulatory Visit | Attending: Orthopedic Surgery | Admitting: Orthopedic Surgery

## 2019-10-07 ENCOUNTER — Other Ambulatory Visit: Payer: Self-pay

## 2019-10-07 DIAGNOSIS — M4326 Fusion of spine, lumbar region: Secondary | ICD-10-CM

## 2019-10-07 MED ORDER — MEPERIDINE HCL 100 MG/ML IJ SOLN
75.0000 mg | Freq: Once | INTRAMUSCULAR | Status: AC
  Administered 2019-10-07: 75 mg via INTRAMUSCULAR

## 2019-10-07 MED ORDER — DIAZEPAM 5 MG PO TABS
10.0000 mg | ORAL_TABLET | Freq: Once | ORAL | Status: AC
  Administered 2019-10-07: 09:00:00 10 mg via ORAL

## 2019-10-07 MED ORDER — IOPAMIDOL (ISOVUE-M 200) INJECTION 41%
20.0000 mL | Freq: Once | INTRAMUSCULAR | Status: AC
  Administered 2019-10-07: 10:00:00 20 mL via INTRATHECAL

## 2019-10-07 MED ORDER — ONDANSETRON HCL 4 MG/2ML IJ SOLN
4.0000 mg | Freq: Once | INTRAMUSCULAR | Status: AC
  Administered 2019-10-07: 4 mg via INTRAMUSCULAR

## 2019-10-07 NOTE — Progress Notes (Signed)
Patient states she has been off Elavil and Phenergan for at least the past two days.

## 2019-10-07 NOTE — Discharge Instructions (Signed)
Myelogram Discharge Instructions  1. Go home and rest quietly for the next 24 hours.  It is important to lie flat for the next 24 hours.  Get up only to go to the restroom.  You may lie in the bed or on a couch on your back, your stomach, your left side or your right side.  You may have one pillow under your head.  You may have pillows between your knees while you are on your side or under your knees while you are on your back.  2. DO NOT drive today.  Recline the seat as far back as it will go, while still wearing your seat belt, on the way home.  3. You may get up to go to the bathroom as needed.  You may sit up for 10 minutes to eat.  You may resume your normal diet and medications unless otherwise indicated.  Drink plenty of extra fluids today and tomorrow.  4. The incidence of a spinal headache with nausea and/or vomiting is about 5% (one in 20 patients).  If you develop a headache, lie flat and drink plenty of fluids until the headache goes away.  Caffeinated beverages may be helpful.  If you develop severe nausea and vomiting or a headache that does not go away with flat bed rest, call [phone].  5. You may resume normal activities after your 24 hours of bed rest is over; however, do not exert yourself strongly or do any heavy lifting tomorrow.  6. Call your physician for a follow-up appointment.    You may resume Elavil and Phenergan on Tuesday, October 08, 2019 after 9:30a.m.

## 2020-01-06 ENCOUNTER — Other Ambulatory Visit (HOSPITAL_COMMUNITY): Payer: Self-pay | Admitting: Internal Medicine

## 2020-01-06 DIAGNOSIS — M79671 Pain in right foot: Secondary | ICD-10-CM

## 2020-01-06 DIAGNOSIS — M79672 Pain in left foot: Secondary | ICD-10-CM

## 2020-01-30 ENCOUNTER — Other Ambulatory Visit: Payer: Self-pay | Admitting: Sports Medicine

## 2020-01-30 DIAGNOSIS — M79671 Pain in right foot: Secondary | ICD-10-CM

## 2020-02-13 ENCOUNTER — Other Ambulatory Visit: Payer: Commercial Managed Care - PPO

## 2021-03-23 ENCOUNTER — Other Ambulatory Visit: Payer: Self-pay | Admitting: Sports Medicine

## 2021-03-23 ENCOUNTER — Other Ambulatory Visit (HOSPITAL_COMMUNITY): Payer: Self-pay | Admitting: Sports Medicine

## 2021-03-23 DIAGNOSIS — M25522 Pain in left elbow: Secondary | ICD-10-CM

## 2021-04-01 ENCOUNTER — Ambulatory Visit
Admission: RE | Admit: 2021-04-01 | Discharge: 2021-04-01 | Disposition: A | Discharge status: Home / Self Care | Payer: Self-pay | Source: Ambulatory Visit | Attending: Sports Medicine | Admitting: Sports Medicine

## 2021-04-01 ENCOUNTER — Other Ambulatory Visit: Payer: Self-pay

## 2021-04-01 ENCOUNTER — Other Ambulatory Visit (HOSPITAL_COMMUNITY): Payer: Self-pay | Admitting: Sports Medicine

## 2021-04-01 ENCOUNTER — Ambulatory Visit (HOSPITAL_COMMUNITY)
Admission: RE | Admit: 2021-04-01 | Discharge: 2021-04-01 | Disposition: A | Discharge status: Home / Self Care | Payer: Medicare Other | Source: Ambulatory Visit | Attending: Sports Medicine | Admitting: Sports Medicine

## 2021-04-01 DIAGNOSIS — W19XXXA Unspecified fall, initial encounter: Secondary | ICD-10-CM

## 2021-04-01 DIAGNOSIS — M25522 Pain in left elbow: Secondary | ICD-10-CM | POA: Insufficient documentation

## 2023-02-09 ENCOUNTER — Other Ambulatory Visit (HOSPITAL_COMMUNITY): Payer: Self-pay | Admitting: Internal Medicine

## 2023-02-09 DIAGNOSIS — Z1231 Encounter for screening mammogram for malignant neoplasm of breast: Secondary | ICD-10-CM

## 2023-02-20 ENCOUNTER — Encounter: Payer: Self-pay | Admitting: *Deleted

## 2023-08-22 ENCOUNTER — Encounter (INDEPENDENT_AMBULATORY_CARE_PROVIDER_SITE_OTHER): Payer: Self-pay | Admitting: *Deleted

## 2023-11-27 NOTE — Progress Notes (Signed)
 Please let patient know the urine culture returned back with no concerns for infection. No antibiotic therapy needed at this time. Thanks.

## 2023-11-27 NOTE — Telephone Encounter (Signed)
 Called pt no answer. Left msg that will send MyChart msg. MyChart msg sent.

## 2023-12-22 ENCOUNTER — Encounter (HOSPITAL_COMMUNITY): Payer: Self-pay

## 2023-12-22 ENCOUNTER — Other Ambulatory Visit: Payer: Self-pay

## 2023-12-22 ENCOUNTER — Emergency Department (HOSPITAL_COMMUNITY)
Admission: EM | Admit: 2023-12-22 | Discharge: 2023-12-22 | Disposition: A | Discharge status: Home / Self Care | Attending: Emergency Medicine | Admitting: Emergency Medicine

## 2023-12-22 DIAGNOSIS — N390 Urinary tract infection, site not specified: Secondary | ICD-10-CM | POA: Diagnosis not present

## 2023-12-22 DIAGNOSIS — R300 Dysuria: Secondary | ICD-10-CM | POA: Diagnosis present

## 2023-12-22 LAB — URINALYSIS, W/ REFLEX TO CULTURE (INFECTION SUSPECTED)
Bilirubin Urine: NEGATIVE
Glucose, UA: NEGATIVE mg/dL
Ketones, ur: 80 mg/dL — AB
Nitrite: POSITIVE — AB
Protein, ur: 100 mg/dL — AB
Specific Gravity, Urine: 1.015 (ref 1.005–1.030)
WBC, UA: >50 WBC/hpf (ref 0–5)
pH: 5 (ref 5.0–8.0)

## 2023-12-22 MED ORDER — ONDANSETRON 4 MG PO TBDP
4.0000 mg | ORAL_TABLET | Freq: Once | ORAL | Status: AC
  Administered 2023-12-22: 4 mg via ORAL
  Filled 2023-12-22: qty 1

## 2023-12-22 MED ORDER — CEPHALEXIN 500 MG PO CAPS
500.0000 mg | ORAL_CAPSULE | Freq: Once | ORAL | Status: AC
  Administered 2023-12-22: 500 mg via ORAL
  Filled 2023-12-22: qty 1

## 2023-12-22 MED ORDER — KETOROLAC TROMETHAMINE 30 MG/ML IJ SOLN
30.0000 mg | Freq: Once | INTRAMUSCULAR | Status: AC
  Administered 2023-12-22: 30 mg via INTRAMUSCULAR
  Filled 2023-12-22: qty 1

## 2023-12-22 MED ORDER — ACETAMINOPHEN 500 MG PO TABS
1000.0000 mg | ORAL_TABLET | Freq: Once | ORAL | Status: AC
  Administered 2023-12-22: 1000 mg via ORAL
  Filled 2023-12-22: qty 2

## 2023-12-22 MED ORDER — ONDANSETRON HCL 4 MG PO TABS: 4.0000 mg | ORAL_TABLET | Freq: Four times a day (QID) | ORAL | 0 refills | Status: AC

## 2023-12-22 MED ORDER — CEPHALEXIN 500 MG PO CAPS: 500.0000 mg | ORAL_CAPSULE | Freq: Three times a day (TID) | ORAL | 0 refills | Status: AC

## 2023-12-22 MED ORDER — CELECOXIB 200 MG PO CAPS: 200.0000 mg | ORAL_CAPSULE | Freq: Two times a day (BID) | ORAL | 0 refills | Status: AC

## 2023-12-22 NOTE — ED Triage Notes (Addendum)
 Pov from home cc of uti, emesis + nausea since Tuesday.  Fever, headache as well.  Went to [facility] urologist Tuesday and was told her urine sample wasn't good but they never did call in any medication.

## 2023-12-22 NOTE — ED Provider Notes (Addendum)
  [facility] EMERGENCY DEPARTMENT AT [facility] Provider Note   CSN: [id number] Arrival date & time: 12/22/23  0401     History Chief Complaint  Patient presents with   Nausea   Recurrent UTI    HPI Colleen Duncan is a 53 y.o. female presenting for fever, nausea, vomiting, severe dysuria.  History of interstitial cystitis but states she saw her urologist earlier this week was diagnosed with a recurrent UTI and was told that they would call in medications but tonight comes in stating that they have not called the results yet.. She states she has had a gradual headache that came on slowly throughout the week and has had daily nausea episodes accompanied by chills.  Patient's recorded medical, surgical, social, medication list and allergies were reviewed in the Snapshot window as part of the initial history.   Review of Systems   Review of Systems  Constitutional:  Positive for chills. Negative for fever.  HENT:  Negative for ear pain and sore throat.   Eyes:  Negative for pain and visual disturbance.  Respiratory:  Negative for cough and shortness of breath.   Cardiovascular:  Negative for chest pain and palpitations.  Gastrointestinal:  Negative for abdominal pain and vomiting.  Genitourinary:  Positive for dysuria and urgency. Negative for hematuria.  Musculoskeletal:  Negative for arthralgias and back pain.  Skin:  Negative for color change and rash.  Neurological:  Positive for headaches. Negative for seizures and syncope.  All other systems reviewed and are negative.   Physical Exam Updated Vital Signs BP (!) 152/86   Pulse (!) 104   Temp 98.2 F (36.8 C) (Oral)   Resp 18   Ht 5' 5 (1.651 m)   Wt 87.1 kg   SpO2 96%   BMI 31.95 kg/m  Physical Exam Vitals and nursing note reviewed.  Constitutional:      General: She is not in acute distress.    Appearance: She is well-developed.  HENT:     Head: Normocephalic and atraumatic.  Eyes:      Conjunctiva/sclera: Conjunctivae normal.  Cardiovascular:     Rate and Rhythm: Normal rate and regular rhythm.     Heart sounds: No murmur heard. Pulmonary:     Effort: Pulmonary effort is normal. No respiratory distress.     Breath sounds: Normal breath sounds.  Abdominal:     General: There is no distension.     Palpations: Abdomen is soft.     Tenderness: There is no abdominal tenderness. There is no right CVA tenderness or left CVA tenderness.  Musculoskeletal:        General: No swelling or tenderness. Normal range of motion.     Cervical back: Neck supple.  Skin:    General: Skin is warm and dry.  Neurological:     General: No focal deficit present.     Mental Status: She is alert and oriented to person, place, and time. Mental status is at baseline.     Cranial Nerves: No cranial nerve deficit.      ED Course/ Medical Decision Making/ A&P    Procedures Procedures   Medications Ordered in ED Medications  acetaminophen  (TYLENOL ) tablet 1,000 mg (has no administration in time range)  cephALEXin  (KEFLEX ) capsule 500 mg (has no administration in time range)  ketorolac  (TORADOL ) 30 MG/ML injection 30 mg (has no administration in time range)  ondansetron  (ZOFRAN -ODT) disintegrating tablet 4 mg (has no administration in time range)    Medical  Decision Making:   Colleen Duncan is a 53 y.o. female who presented to the ED today with urinary discomfort detailed above.    External chart has been reviewed including [facility] records including outpatient culture showing positive E. coli pansensitive urine culture in the past also retrieved report showing E. coli greater than 100,000 colony-forming units of E. coli pansensitive again earlier this week. Patient placed on continuous vitals and telemetry monitoring while in ED which was reviewed periodically.  Complete initial physical exam performed, notably the patient  was hemodynamically stable no acute distress.    Reviewed and  confirmed nursing documentation for past medical history, family history, social history.   Repeat urine culture sent to lab.  However I believe patient to be started on therapy including cephalexin , NSAIDs for discomfort (outpatient prior BMPs without evidence of kidney dysfunction) Tylenol  for headaches and Zofran  for nausea.  Patient to follow-up with her primary care team in the outpatient setting. Concerning her headache, likely related to fevers and infection.  She is not meningitic, has had a slow onset that is inconsistent with a subarachnoid hemorrhage or other intracranial pathology.  Will treat with NSAIDs Tylenol  recommend reassessment if not improving. Disposition:  I have considered need for hospitalization, however, considering all of the above, I believe this patient is stable for discharge at this time.  Patient/family educated about specific return precautions for given chief complaint and symptoms.  Patient/family educated about follow-up with PCP.     Patient/family expressed understanding of return precautions and need for follow-up. Patient spoken to regarding all imaging and laboratory results and appropriate follow up for these results. All education provided in verbal form with additional information in written form. Time was allowed for answering of patient questions. Patient discharged.    Emergency Department Medication Summary:   Medications  acetaminophen  (TYLENOL ) tablet 1,000 mg (has no administration in time range)  cephALEXin  (KEFLEX ) capsule 500 mg (has no administration in time range)  ketorolac  (TORADOL ) 30 MG/ML injection 30 mg (has no administration in time range)  ondansetron  (ZOFRAN -ODT) disintegrating tablet 4 mg (has no administration in time range)         Clinical Impression:  1. Urinary tract infection without hematuria, site unspecified      Discharge   Final Clinical Impression(s) / ED Diagnoses Final diagnoses:  Urinary tract infection  without hematuria, site unspecified    Rx / DC Orders ED Discharge Orders          Ordered    cephALEXin  (KEFLEX ) 500 MG capsule  3 times daily        12/22/23 0439    ondansetron  (ZOFRAN ) 4 MG tablet  Every 6 hours        12/22/23 0439    celecoxib  (CELEBREX ) 200 MG capsule  2 times daily        12/22/23 0439              Jerral Meth, MD 12/22/23 0440    Jerral Meth, MD 12/22/23 [phone]

## 2023-12-24 LAB — URINE CULTURE: Culture: >=100000 — AB

## 2023-12-25 ENCOUNTER — Telehealth (HOSPITAL_BASED_OUTPATIENT_CLINIC_OR_DEPARTMENT_OTHER): Payer: Self-pay | Admitting: *Deleted

## 2023-12-25 NOTE — Telephone Encounter (Signed)
 Post ED Visit - Positive Culture Follow-up  Culture report reviewed by antimicrobial stewardship pharmacist: [facility] [x]  Vito Ralph, Pharm.D. []  Venetia Gully, Pharm.D., BCPS AQ-ID []  Garrel Crews, Pharm.D., BCPS []  Almarie Lunger, [address].D., BCPS []  [place], [address].D., BCPS, AAHIVP []  Rosaline Bihari, Pharm.D., BCPS, AAHIVP []  Vernell Meier, PharmD, BCPS []  Latanya Hint, PharmD, BCPS []  Donald Medley, PharmD, BCPS []  Rocky Bold, PharmD []  Dorothyann Alert, PharmD, BCPS []  Morene Babe, PharmD  [facility] []  Rosaline Edison, PharmD []  Romona Bliss, PharmD []  Dolphus Roller, PharmD []  Veva Seip, Rph []  Vernell Daunt) Leonce, PharmD []  Eva Allis, PharmD []  Rosaline Millet, PharmD []  Iantha Batch, PharmD []  Arvin Gauss, PharmD []  Wanda Hasting, PharmD []  Ronal Rav, PharmD []  Rocky Slade, PharmD []  Bard Jeans, PharmD   Positive urine culture Treated with cephalexin , organism sensitive to the same and no further patient follow-up is required at this time.  Colleen Duncan 12/25/2023, 9:13 AM
